# Patient Record
Sex: Male | Born: 1960 | Race: Black or African American | Hispanic: No | Marital: Married | Smoking: Never smoker
Health system: Southern US, Community
[De-identification: ages and names within clinical notes are randomized; demographics above are authoritative.]

## PROBLEM LIST (undated history)

## (undated) DIAGNOSIS — K259 Gastric ulcer, unspecified as acute or chronic, without hemorrhage or perforation: Secondary | ICD-10-CM

## (undated) DIAGNOSIS — I639 Cerebral infarction, unspecified: Secondary | ICD-10-CM

## (undated) HISTORY — PX: CHOLECYSTECTOMY: SHX55

## (undated) HISTORY — PX: ANKLE ARTHROSCOPY: SUR85

---

## 2009-02-18 ENCOUNTER — Emergency Department (HOSPITAL_BASED_OUTPATIENT_CLINIC_OR_DEPARTMENT_OTHER): Admission: EM | Admit: 2009-02-18 | Discharge: 2009-02-18 | Payer: Self-pay | Admitting: Emergency Medicine

## 2009-10-07 ENCOUNTER — Ambulatory Visit: Payer: Self-pay | Admitting: Radiology

## 2009-10-07 ENCOUNTER — Emergency Department (HOSPITAL_BASED_OUTPATIENT_CLINIC_OR_DEPARTMENT_OTHER): Admission: EM | Admit: 2009-10-07 | Discharge: 2009-10-07 | Payer: Self-pay | Admitting: Emergency Medicine

## 2010-04-13 ENCOUNTER — Emergency Department (HOSPITAL_BASED_OUTPATIENT_CLINIC_OR_DEPARTMENT_OTHER): Admission: EM | Admit: 2010-04-13 | Discharge: 2010-04-13 | Payer: Self-pay | Admitting: Emergency Medicine

## 2012-01-31 ENCOUNTER — Emergency Department (HOSPITAL_BASED_OUTPATIENT_CLINIC_OR_DEPARTMENT_OTHER): Payer: Federal, State, Local not specified - PPO

## 2012-01-31 ENCOUNTER — Emergency Department (HOSPITAL_BASED_OUTPATIENT_CLINIC_OR_DEPARTMENT_OTHER)
Admission: EM | Admit: 2012-01-31 | Discharge: 2012-01-31 | Disposition: A | Discharge status: Home / Self Care | Payer: Federal, State, Local not specified - PPO | Attending: Emergency Medicine | Admitting: Emergency Medicine

## 2012-01-31 ENCOUNTER — Encounter (HOSPITAL_BASED_OUTPATIENT_CLINIC_OR_DEPARTMENT_OTHER): Payer: Self-pay | Admitting: *Deleted

## 2012-01-31 DIAGNOSIS — J189 Pneumonia, unspecified organism: Secondary | ICD-10-CM | POA: Insufficient documentation

## 2012-01-31 DIAGNOSIS — R197 Diarrhea, unspecified: Secondary | ICD-10-CM | POA: Insufficient documentation

## 2012-01-31 LAB — URINALYSIS, ROUTINE W REFLEX MICROSCOPIC
Ketones, ur: 15 mg/dL — AB
Leukocytes, UA: NEGATIVE
Nitrite: NEGATIVE
Protein, ur: NEGATIVE mg/dL
pH: 5.5 (ref 5.0–8.0)

## 2012-01-31 LAB — CBC WITH DIFFERENTIAL/PLATELET
Basophils Relative: 0 % (ref 0–1)
Eosinophils Absolute: 0.1 10*3/uL (ref 0.0–0.7)
Eosinophils Relative: 1 % (ref 0–5)
HCT: 39.1 % (ref 39.0–52.0)
Hemoglobin: 13.9 g/dL (ref 13.0–17.0)
Lymphocytes Relative: 16 % (ref 12–46)
Lymphs Abs: 1.3 10*3/uL (ref 0.7–4.0)
MCHC: 35.5 g/dL (ref 30.0–36.0)
Monocytes Absolute: 0.8 10*3/uL (ref 0.1–1.0)
Monocytes Relative: 10 % (ref 3–12)
Platelets: 320 10*3/uL (ref 150–400)

## 2012-01-31 LAB — BASIC METABOLIC PANEL
BUN: 11 mg/dL (ref 6–23)
GFR calc Af Amer: >90 mL/min (ref >90)
Glucose, Bld: 115 mg/dL — ABNORMAL HIGH (ref 70–99)

## 2012-01-31 LAB — URINE MICROSCOPIC-ADD ON

## 2012-01-31 MED ORDER — GI COCKTAIL ~~LOC~~
30.0000 mL | Freq: Once | ORAL | Status: AC
  Administered 2012-01-31: 30 mL via ORAL
  Filled 2012-01-31: qty 30

## 2012-01-31 MED ORDER — AMOXICILLIN-POT CLAVULANATE 875-125 MG PO TABS: 1.0000 | ORAL_TABLET | Freq: Two times a day (BID) | ORAL | Status: AC

## 2012-01-31 NOTE — ED Notes (Signed)
States he has N/V/D, "cant keep nothing down", stomach "just hurts", and is coughing.

## 2012-01-31 NOTE — ED Provider Notes (Addendum)
History     CSN: [id number]  Arrival date & time 01/31/12  0316   First MD Initiated Contact with Patient 01/31/12 [phone]      Chief Complaint  Patient presents with  . Abdominal Pain    (Consider location/radiation/quality/duration/timing/severity/associated sxs/prior treatment) Patient is a 51 y.o. male presenting with abdominal pain. The history is provided by the patient.  Abdominal Pain The primary symptoms of the illness include abdominal pain, fever and diarrhea. The primary symptoms of the illness do not include shortness of breath or vomiting. The current episode started more than 2 days ago. The onset of the illness was gradual. The problem has not changed since onset. The abdominal pain began more than 2 days ago. The pain came on gradually. The abdominal pain has been unchanged since its onset. The abdominal pain is generalized. The abdominal pain does not radiate. The severity of the abdominal pain is 5/10. The abdominal pain is relieved by nothing. The abdominal pain is exacerbated by bowel movements.  The fever began 3 to 5 days ago. The fever has been unchanged since its onset. The maximum temperature recorded prior to his arrival was more than 104 F.  The diarrhea began 3 to 5 days ago.  The illness is associated with a recent illness. The patient has had a change in bowel habit. Symptoms associated with the illness do not include chills, anorexia, diaphoresis, constipation, urgency, hematuria, frequency or back pain.  Has also had a cough which is not productive.   History reviewed. No pertinent past medical history.  History reviewed. No pertinent past surgical history.  No family history on file.  History  Substance Use Topics  . Smoking status: Never Smoker   . Smokeless tobacco: Not on file  . Alcohol Use: No      Review of Systems  Constitutional: Positive for fever. Negative for chills and diaphoresis.  HENT: Negative for neck pain.   Respiratory:  Positive for cough. Negative for shortness of breath.   Cardiovascular: Negative for chest pain.  Gastrointestinal: Positive for abdominal pain and diarrhea. Negative for vomiting, constipation and anorexia.  Genitourinary: Negative for urgency, frequency and hematuria.  Musculoskeletal: Negative for back pain.  All other systems reviewed and are negative.    Allergies  Morphine and related  Home Medications   Current Outpatient Rx  Name Route Sig Dispense Refill  . AMOXICILLIN-POT CLAVULANATE 875-125 MG PO TABS Oral Take 1 tablet by mouth every 12 (twelve) hours. 20 tablet 0    BP 131/91  Pulse 92  Temp 98.4 F (36.9 C) (Oral)  Resp 20  SpO2 99%  Physical Exam  Constitutional: He is oriented to person, place, and time. He appears well-developed and well-nourished. No distress.  HENT:  Head: Normocephalic and atraumatic.  Mouth/Throat: Oropharynx is clear and moist.  Eyes: Conjunctivae are normal. Pupils are equal, round, and reactive to light.  Neck: Normal range of motion. Neck supple.  Cardiovascular: Normal rate and regular rhythm.   Pulmonary/Chest: Effort normal. He has decreased breath sounds. He has no wheezes. He exhibits no tenderness.  Abdominal: Soft. Bowel sounds are normal. There is no tenderness. There is no rebound and no guarding.  Musculoskeletal: Normal range of motion.  Neurological: He is alert and oriented to person, place, and time.  Skin: Skin is warm and dry. He is not diaphoretic.  Psychiatric: He has a normal mood and affect.    ED Course  Procedures (including critical care time)  Labs Reviewed  BASIC METABOLIC PANEL - Abnormal; Notable for the following:    Sodium 133 (*)     Glucose, Bld 115 (*)     GFR calc non Af Amer 85 (*)     All other components within normal limits  URINALYSIS, ROUTINE W REFLEX MICROSCOPIC - Abnormal; Notable for the following:    Color, Urine AMBER (*)  BIOCHEMICALS MAY BE AFFECTED BY COLOR   Hgb urine  dipstick SMALL (*)     Bilirubin Urine SMALL (*)     Ketones, ur 15 (*)     All other components within normal limits  URINE MICROSCOPIC-ADD ON - Abnormal; Notable for the following:    Bacteria, UA FEW (*)     All other components within normal limits  CBC WITH DIFFERENTIAL   Ct Abdomen Pelvis Wo Contrast  01/31/2012  *RADIOLOGY REPORT*  Clinical Data: Nausea, vomiting, diarrhea.  Cough.  CT ABDOMEN AND PELVIS WITHOUT CONTRAST  Technique:  Multidetector CT imaging of the abdomen and pelvis was performed following the standard protocol without intravenous contrast.  Comparison: None.  Findings: Atelectasis or infiltration in the right lung base.  The unenhanced appearance of the liver, spleen, gallbladder, pancreas, adrenal glands, abdominal aorta, and retroperitoneal lymph nodes is unremarkable.  Punctate sized stone in the midportion of the left kidney.  No evidence of pyelocaliectasis or ureterectasis.  No ureteral or bladder stones are visualized.  The bladder is decompressed which likely accounts for apparent wall thickening.  The stomach, small bowel, and colon are not abnormally distended. No free air or free fluid in the abdomen.  Pelvis:  Prostate gland is not enlarged.  No free or loculated pelvic fluid collections.  The appendix is normal.  No diverticulitis.  Normal alignment of the lumbar vertebrae.  IMPRESSION: Infiltration or atelectasis in the right lung base.  Punctate nonobstructing left intrarenal stone.  No acute process demonstrated in the abdomen or pelvis.  Original Report Authenticated By: Marlon Pel, M.D.     1. Community acquired pneumonia   2. Diarrhea       MDM  Return immediately for chest pain, peristent fevers, shortness of breath, persistent diarrhea or any concerns.  Patient verbalizes understanding and agrees to follow up        Artina Minella K Avanelle Pixley-Rasch, MD 01/31/12 0502  Cleotha Tsang Smitty Cords, MD 01/31/12 1610

## 2013-03-12 ENCOUNTER — Emergency Department (HOSPITAL_BASED_OUTPATIENT_CLINIC_OR_DEPARTMENT_OTHER): Payer: Federal, State, Local not specified - PPO

## 2013-03-12 ENCOUNTER — Encounter (HOSPITAL_BASED_OUTPATIENT_CLINIC_OR_DEPARTMENT_OTHER): Payer: Self-pay

## 2013-03-12 ENCOUNTER — Emergency Department (HOSPITAL_BASED_OUTPATIENT_CLINIC_OR_DEPARTMENT_OTHER)
Admission: EM | Admit: 2013-03-12 | Discharge: 2013-03-12 | Disposition: A | Discharge status: Home / Self Care | Payer: Federal, State, Local not specified - PPO | Attending: Emergency Medicine | Admitting: Emergency Medicine

## 2013-03-12 DIAGNOSIS — R109 Unspecified abdominal pain: Secondary | ICD-10-CM

## 2013-03-12 DIAGNOSIS — R1011 Right upper quadrant pain: Secondary | ICD-10-CM | POA: Insufficient documentation

## 2013-03-12 LAB — COMPREHENSIVE METABOLIC PANEL
ALT: 19 U/L (ref 0–53)
Albumin: 3.8 g/dL (ref 3.5–5.2)
Alkaline Phosphatase: 73 U/L (ref 39–117)
CO2: 30 mEq/L (ref 19–32)
Calcium: 9.4 mg/dL (ref 8.4–10.5)
Creatinine, Ser: 1 mg/dL (ref 0.50–1.35)
GFR calc non Af Amer: 84 mL/min — ABNORMAL LOW (ref >90)
Potassium: 3.4 mEq/L — ABNORMAL LOW (ref 3.5–5.1)

## 2013-03-12 LAB — URINALYSIS, ROUTINE W REFLEX MICROSCOPIC
Ketones, ur: NEGATIVE mg/dL
Leukocytes, UA: NEGATIVE
Protein, ur: NEGATIVE mg/dL
Specific Gravity, Urine: 1.023 (ref 1.005–1.030)
Urobilinogen, UA: 1 mg/dL (ref 0.0–1.0)

## 2013-03-12 LAB — URINE MICROSCOPIC-ADD ON

## 2013-03-12 LAB — CBC WITH DIFFERENTIAL/PLATELET
Basophils Absolute: 0 10*3/uL (ref 0.0–0.1)
Basophils Relative: 0 % (ref 0–1)
Eosinophils Absolute: 0.3 10*3/uL (ref 0.0–0.7)
HCT: 42.6 % (ref 39.0–52.0)
Hemoglobin: 14.9 g/dL (ref 13.0–17.0)
Lymphocytes Relative: 32 % (ref 12–46)
Monocytes Absolute: 1 10*3/uL (ref 0.1–1.0)
Neutrophils Relative %: 57 % (ref 43–77)
RDW: 12.4 % (ref 11.5–15.5)
WBC: 12.5 10*3/uL — ABNORMAL HIGH (ref 4.0–10.5)

## 2013-03-12 MED ORDER — HYDROMORPHONE HCL PF 1 MG/ML IJ SOLN
1.0000 mg | Freq: Once | INTRAMUSCULAR | Status: AC
  Administered 2013-03-12: 1 mg via INTRAVENOUS
  Filled 2013-03-12: qty 1

## 2013-03-12 MED ORDER — SODIUM CHLORIDE 0.9 % IV BOLUS (SEPSIS)
1000.0000 mL | Freq: Once | INTRAVENOUS | Status: AC
  Administered 2013-03-12: 1000 mL via INTRAVENOUS

## 2013-03-12 MED ORDER — HYDROCODONE-ACETAMINOPHEN 5-325 MG PO TABS
2.0000 | ORAL_TABLET | ORAL | Status: DC | PRN
Start: 2013-03-12 — End: 2020-07-12

## 2013-03-12 MED ORDER — IOHEXOL 300 MG/ML  SOLN
50.0000 mL | Freq: Once | INTRAMUSCULAR | Status: AC | PRN
  Administered 2013-03-12: 50 mL via ORAL

## 2013-03-12 MED ORDER — IOHEXOL 300 MG/ML  SOLN
100.0000 mL | Freq: Once | INTRAMUSCULAR | Status: AC | PRN
  Administered 2013-03-12: 100 mL via INTRAVENOUS

## 2013-03-12 MED ORDER — PROMETHAZINE HCL 25 MG/ML IJ SOLN
12.5000 mg | Freq: Once | INTRAMUSCULAR | Status: AC
  Administered 2013-03-12: 12.5 mg via INTRAVENOUS
  Filled 2013-03-12: qty 1

## 2013-03-12 NOTE — ED Provider Notes (Signed)
CSN: [id number]     Arrival date & time 03/12/13  4098 History   First MD Initiated Contact with Patient 03/12/13 [phone]     Chief Complaint  Patient presents with  . Abdominal Pain   (Consider location/radiation/quality/duration/timing/severity/associated sxs/prior Treatment) HPI Comments: Patient is a 52 year old otherwise healthy male who presents with complaints of right upper quadrant abdominal pain. He states that this started 2 hours prior to arrival and has worsened rapidly. He denies vomiting or diarrhea. He denies any constipation. He denies fever. He denies any history of gallstones and denies he has had any similar episodes in the past the  Patient is a 52 y.o. male presenting with abdominal pain. The history is provided by the patient.  Abdominal Pain Pain location:  RUQ Pain quality: cramping   Pain radiates to:  Does not radiate Pain severity:  Moderate Onset quality:  Sudden Duration:  2 hours Timing:  Constant Progression:  Worsening Chronicity:  New Context: not alcohol use, not diet changes and not recent illness   Relieved by:  Nothing Worsened by:  Movement and palpation Ineffective treatments:  None tried   History reviewed. No pertinent past medical history. History reviewed. No pertinent past surgical history. No family history on file. History  Substance Use Topics  . Smoking status: Never Smoker   . Smokeless tobacco: Not on file  . Alcohol Use: No    Review of Systems  Gastrointestinal: Positive for abdominal pain.  All other systems reviewed and are negative.    Allergies  Morphine and related  Home Medications  No current outpatient prescriptions on file. BP 163/106  Pulse 79  Temp(Src) 98.5 F (36.9 C) (Oral)  Resp 20  Ht 6\' 1"  (1.854 m)  Wt 217 lb (98.431 kg)  BMI 28.64 kg/m2  SpO2 100% Physical Exam  Nursing note and vitals reviewed. Constitutional: He is oriented to person, place, and time. He appears well-developed and  well-nourished. No distress.  HENT:  Head: Normocephalic and atraumatic.  Mouth/Throat: Oropharynx is clear and moist.  Neck: Normal range of motion. Neck supple.  Cardiovascular: Normal rate, regular rhythm and normal heart sounds.   No murmur heard. Pulmonary/Chest: Effort normal and breath sounds normal. No respiratory distress.  Abdominal: Bowel sounds are normal. He exhibits no distension and no mass. There is tenderness. There is no rebound and no guarding.  There is tenderness to palpation in the right upper quadrant and right mid abdomen without rebound or guarding. Bowel sounds are present  Neurological: He is alert and oriented to person, place, and time.  Skin: Skin is warm and dry. He is not diaphoretic.    ED Course  Procedures (including critical care time) Labs Review Labs Reviewed  URINALYSIS, ROUTINE W REFLEX MICROSCOPIC  CBC WITH DIFFERENTIAL  COMPREHENSIVE METABOLIC PANEL  LIPASE, BLOOD   Imaging Review No results found.  MDM  No diagnosis found. Patient presents here with complaints of right upper abdominal pain that started abruptly at approximately 2 AM. He is tender in this area however there is no rebound or guarding. Workup reveals a mildly elevated white count of 12,000, however CT scan of the abdomen and pelvis is normal without evidence for intra-abdominal pathology. Specifically his gallbladder and appendix appear normal. He will be discharged to home with pain medication and followup as needed if his symptoms worsen or change.     Geoffery Lyons, MD 03/12/13 1008

## 2013-03-12 NOTE — ED Notes (Signed)
MD at bedside. 

## 2013-03-12 NOTE — ED Notes (Signed)
MD at bedside for disposition 

## 2013-03-12 NOTE — ED Notes (Signed)
Returned from CT.

## 2013-03-12 NOTE — ED Notes (Signed)
Patient instructed not to drive, attempting to find ride

## 2013-03-12 NOTE — ED Notes (Signed)
Patient here with epigastric pain x 2 hours. Reports that the pain has increased with nausea, denies diarrhea. Reports remote hx of ulcers, denies noticing any blood in stool

## 2014-02-25 ENCOUNTER — Emergency Department (HOSPITAL_BASED_OUTPATIENT_CLINIC_OR_DEPARTMENT_OTHER): Payer: Federal, State, Local not specified - PPO

## 2014-02-25 ENCOUNTER — Encounter (HOSPITAL_BASED_OUTPATIENT_CLINIC_OR_DEPARTMENT_OTHER): Payer: Self-pay | Admitting: Emergency Medicine

## 2014-02-25 ENCOUNTER — Emergency Department (HOSPITAL_BASED_OUTPATIENT_CLINIC_OR_DEPARTMENT_OTHER)
Admission: EM | Admit: 2014-02-25 | Discharge: 2014-02-25 | Disposition: A | Discharge status: Home / Self Care | Payer: Federal, State, Local not specified - PPO | Attending: Emergency Medicine | Admitting: Emergency Medicine

## 2014-02-25 DIAGNOSIS — Z79899 Other long term (current) drug therapy: Secondary | ICD-10-CM | POA: Diagnosis not present

## 2014-02-25 DIAGNOSIS — R21 Rash and other nonspecific skin eruption: Secondary | ICD-10-CM | POA: Insufficient documentation

## 2014-02-25 DIAGNOSIS — IMO0002 Reserved for concepts with insufficient information to code with codable children: Secondary | ICD-10-CM | POA: Insufficient documentation

## 2014-02-25 DIAGNOSIS — R0609 Other forms of dyspnea: Secondary | ICD-10-CM | POA: Diagnosis not present

## 2014-02-25 DIAGNOSIS — R0989 Other specified symptoms and signs involving the circulatory and respiratory systems: Secondary | ICD-10-CM | POA: Insufficient documentation

## 2014-02-25 DIAGNOSIS — R0600 Dyspnea, unspecified: Secondary | ICD-10-CM

## 2014-02-25 LAB — CBC WITH DIFFERENTIAL/PLATELET
BASOS ABS: 0 10*3/uL (ref 0.0–0.1)
Basophils Relative: 1 % (ref 0–1)
EOS ABS: 0.4 10*3/uL (ref 0.0–0.7)
EOS PCT: 5 % (ref 0–5)
HEMATOCRIT: 42.2 % (ref 39.0–52.0)
Hemoglobin: 14.9 g/dL (ref 13.0–17.0)
LYMPHS ABS: 3 10*3/uL (ref 0.7–4.0)
LYMPHS PCT: 40 % (ref 12–46)
MCH: 31.6 pg (ref 26.0–34.0)
MCHC: 35.3 g/dL (ref 30.0–36.0)
MCV: 89.6 fL (ref 78.0–100.0)
MONOS PCT: 10 % (ref 3–12)
Monocytes Absolute: 0.7 10*3/uL (ref 0.1–1.0)
NEUTROS PCT: 44 % (ref 43–77)
Neutro Abs: 3.5 10*3/uL (ref 1.7–7.7)
PLATELETS: 280 10*3/uL (ref 150–400)
RBC: 4.71 MIL/uL (ref 4.22–5.81)
RDW: 12.8 % (ref 11.5–15.5)
WBC: 7.6 10*3/uL (ref 4.0–10.5)

## 2014-02-25 LAB — BASIC METABOLIC PANEL
Anion gap: 12 (ref 5–15)
BUN: 10 mg/dL (ref 6–23)
CHLORIDE: 99 meq/L (ref 96–112)
CO2: 26 mEq/L (ref 19–32)
CREATININE: 0.9 mg/dL (ref 0.50–1.35)
Calcium: 9.5 mg/dL (ref 8.4–10.5)
GFR calc Af Amer: >90 mL/min (ref >90)
GFR calc non Af Amer: >90 mL/min (ref >90)
Glucose, Bld: 101 mg/dL — ABNORMAL HIGH (ref 70–99)
Potassium: 4.1 mEq/L (ref 3.7–5.3)
Sodium: 137 mEq/L (ref 137–147)

## 2014-02-25 LAB — TROPONIN I

## 2014-02-25 LAB — D-DIMER, QUANTITATIVE (NOT AT ARMC)

## 2014-02-25 MED ORDER — ALBUTEROL SULFATE HFA 108 (90 BASE) MCG/ACT IN AERS: 1.0000 | INHALATION_SPRAY | Freq: Four times a day (QID) | RESPIRATORY_TRACT | Status: AC | PRN

## 2014-02-25 MED ORDER — DIPHENHYDRAMINE HCL 25 MG PO TABS: 25.0000 mg | ORAL_TABLET | Freq: Four times a day (QID) | ORAL | Status: DC

## 2014-02-25 MED ORDER — HYDROCORTISONE 1 % EX CREA: TOPICAL_CREAM | CUTANEOUS | Status: DC

## 2014-02-25 NOTE — Discharge Instructions (Signed)
Rash There is no evidence of heart attack or blood clot in the lung.  Use the inhaler and cream as prescribed. Return to the ED if you develop new or worsening symptoms. A rash is a change in the color or texture of your skin. There are many different types of rashes. You may have other problems that accompany your rash. CAUSES   Infections.  Allergic reactions. This can include allergies to pets or foods.  Certain medicines.  Exposure to certain chemicals, soaps, or cosmetics.  Heat.  Exposure to poisonous plants.  Tumors, both cancerous and noncancerous. SYMPTOMS   Redness.  Scaly skin.  Itchy skin.  Dry or cracked skin.  Bumps.  Blisters.  Pain. DIAGNOSIS  Your caregiver may do a physical exam to determine what type of rash you have. A skin sample (biopsy) may be taken and examined under a microscope. TREATMENT  Treatment depends on the type of rash you have. Your caregiver may prescribe certain medicines. For serious conditions, you may need to see a skin doctor (dermatologist). HOME CARE INSTRUCTIONS   Avoid the substance that caused your rash.  Do not scratch your rash. This can cause infection.  You may take cool baths to help stop itching.  Only take over-the-counter or prescription medicines as directed by your caregiver.  Keep all follow-up appointments as directed by your caregiver. SEEK IMMEDIATE MEDICAL CARE IF:  You have increasing pain, swelling, or redness.  You have a fever.  You have new or severe symptoms.  You have body aches, diarrhea, or vomiting.  Your rash is not better after 3 days. MAKE SURE YOU:  Understand these instructions.  Will watch your condition.  Will get help right away if you are not doing well or get worse. Document Released: 05/22/2002 Document Revised: 08/24/2011 Document Reviewed: 03/16/2011 [facility] Patient Information 2015 [place], [place]. This information is not intended to replace advice given to you by  your health care provider. Make sure you discuss any questions you have with your health care provider.

## 2014-02-25 NOTE — ED Notes (Addendum)
Patient states that he thinks he has poison ivy exposure, did yard work Monday. States that he is having shortness of breath. Stated he has poison ivy in his lungs.

## 2014-02-25 NOTE — ED Provider Notes (Signed)
CSN: [id number]     Arrival date & time 02/25/14  1140 History   First MD Initiated Contact with Patient 02/25/14 1255   This chart was scribed for Glynn Octave, MD by Freida Busman, ED Scribe. This patient was seen in room MHOTF/OTF and the patient's care was started 1:08 PM.    Chief Complaint  Patient presents with  . Rash      The history is provided by the patient.    HPI Comments:  Gregory Wong is a 53 y.o. male who presents to the Emergency Department complaining of rash to bilateral forearms that appeared three days ago. Py notes he was working in his yard seven days ago, believes he was exposed to poison ivy but notes he did not see any poison ivy leaves. Pt also reports burning sensation in his lungs and CP with inspiration. Denies cough, fever and  h/o COPD/asthma. No alleviating factors noted.   History reviewed. No pertinent past medical history. Past Surgical History  Procedure Laterality Date  . Ankle arthroscopy    . Cholecystectomy     No family history on file. History  Substance Use Topics  . Smoking status: Never Smoker   . Smokeless tobacco: Not on file  . Alcohol Use: No    Review of Systems  Skin: Positive for rash.  All other systems reviewed and are negative.     Allergies  Morphine and related  Home Medications   Prior to Admission medications   Medication Sig Start Date End Date Taking? Authorizing Provider  albuterol (PROVENTIL HFA;VENTOLIN HFA) 108 (90 BASE) MCG/ACT inhaler Inhale 1-2 puffs into the lungs every 6 (six) hours as needed for wheezing or shortness of breath. 02/25/14   Glynn Octave, MD  diphenhydrAMINE (BENADRYL) 25 MG tablet Take 1 tablet (25 mg total) by mouth every 6 (six) hours. 02/25/14   Glynn Octave, MD  HYDROcodone-acetaminophen (NORCO) 5-325 MG per tablet Take 2 tablets by mouth every 4 (four) hours as needed for pain. 03/12/13   Geoffery Lyons, MD  hydrocortisone cream 1 % Apply to affected area 2 times daily  02/25/14   Glynn Octave, MD   BP 117/81  Pulse 80  Temp(Src) 97.9 F (36.6 C) (Oral)  Resp 18  Ht  (1.854 m)  Wt 217 lb (98.431 kg)  BMI 28.64 kg/m2  SpO2 99% Physical Exam  Nursing note and vitals reviewed. Constitutional: He is oriented to person, place, and time. He appears well-developed and well-nourished. No distress.  HENT:  Head: Normocephalic and atraumatic.  Mouth/Throat: Oropharynx is clear and moist. No oropharyngeal exudate.  Eyes: Conjunctivae and EOM are normal. Pupils are equal, round, and reactive to light.  Neck: Normal range of motion. Neck supple.  No meningismus.  Cardiovascular: Normal rate, regular rhythm, normal heart sounds and intact distal pulses.   No murmur heard. Pulmonary/Chest: Effort normal and breath sounds normal. No respiratory distress.  Abdominal: Soft. There is no tenderness. There is no rebound and no guarding.  Musculoskeletal: Normal range of motion. He exhibits no edema and no tenderness.  Neurological: He is alert and oriented to person, place, and time. No cranial nerve deficit. He exhibits normal muscle tone. Coordination normal.  No ataxia on finger to nose bilaterally. No pronator drift. 5/5 strength throughout. CN 2-12 intact. Negative Romberg. Equal grip strength. Sensation intact. Gait is normal.   Skin: Skin is warm.  Fluid filled bulla to right ulnar wrist  Excoriations to left forearm   Psychiatric: He has  a normal mood and affect. His behavior is normal.    ED Course  Procedures (including critical care time)  DIAGNOSTIC STUDIES:  Oxygen Saturation is 98% on RA, normal by my interpretation.    COORDINATION OF CARE:  5:22 PM Discussed treatment plan with pt at bedside and pt agreed to plan.  Labs Review Labs Reviewed  BASIC METABOLIC PANEL - Abnormal; Notable for the following:    Glucose, Bld 101 (*)    All other components within normal limits  CBC WITH DIFFERENTIAL  TROPONIN I  D-DIMER, QUANTITATIVE     Imaging Review Dg Chest 2 View  02/25/2014   CLINICAL DATA:  Poison ivy exposure.  Shortness of breath.  EXAM: CHEST  2 VIEW  COMPARISON:  01/09/2014.  FINDINGS: The heart size and mediastinal contours are within normal limits. Both lungs are clear. The visualized skeletal structures are unremarkable. Cholecystectomy clips noted within the right upper quadrant.  IMPRESSION: No active cardiopulmonary disease.   Electronically Signed   By: Signa Kell M.D.   On: 02/25/2014 13:04     EKG Interpretation   Date/Time:  Sunday February 25 2014 13:41:02 EDT Ventricular Rate:  59 PR Interval:  174 QRS Duration: 102 QT Interval:  406 QTC Calculation: 401 R Axis:   -14 Text Interpretation:  Sinus bradycardia with sinus arrhythmia Otherwise  normal ECG No previous ECGs available Confirmed by Manus Gunning  MD, Aravind Chrismer  [id number]) on 02/25/2014 2:02:27 PM      MDM   Final diagnoses:  Dyspnea  Rash   patient feels "burning in his chest" since being exposed to poison ivy 6 days ago. Has a rash to his right wrist with a fluid-filled bulla. Denies any chest pain with exertion.  EKG normal sinus rhythm without acute ST changes. CXR negative.  Troponin and d-dimer negative.  Lungs clear.  Albuterol given for symptom control.  Hydrocortisone for rash.  Doubt ACS, PE, aortic dissection.  I personally performed the services described in this documentation, which was scribed in my presence. The recorded information has been reviewed and is accurate.   Glynn Octave, MD 02/25/14 1723

## 2014-12-17 ENCOUNTER — Encounter (HOSPITAL_BASED_OUTPATIENT_CLINIC_OR_DEPARTMENT_OTHER): Payer: Self-pay | Admitting: Emergency Medicine

## 2014-12-17 ENCOUNTER — Emergency Department (HOSPITAL_BASED_OUTPATIENT_CLINIC_OR_DEPARTMENT_OTHER)
Admission: EM | Admit: 2014-12-17 | Discharge: 2014-12-18 | Disposition: A | Discharge status: Home / Self Care | Payer: BLUE CROSS/BLUE SHIELD | Attending: Emergency Medicine | Admitting: Emergency Medicine

## 2014-12-17 DIAGNOSIS — Z79899 Other long term (current) drug therapy: Secondary | ICD-10-CM | POA: Diagnosis not present

## 2014-12-17 DIAGNOSIS — Y9289 Other specified places as the place of occurrence of the external cause: Secondary | ICD-10-CM | POA: Diagnosis not present

## 2014-12-17 DIAGNOSIS — Z7952 Long term (current) use of systemic steroids: Secondary | ICD-10-CM | POA: Insufficient documentation

## 2014-12-17 DIAGNOSIS — Z9889 Other specified postprocedural states: Secondary | ICD-10-CM | POA: Diagnosis not present

## 2014-12-17 DIAGNOSIS — Y9389 Activity, other specified: Secondary | ICD-10-CM | POA: Diagnosis not present

## 2014-12-17 DIAGNOSIS — X58XXXA Exposure to other specified factors, initial encounter: Secondary | ICD-10-CM | POA: Diagnosis not present

## 2014-12-17 DIAGNOSIS — S93402A Sprain of unspecified ligament of left ankle, initial encounter: Secondary | ICD-10-CM | POA: Insufficient documentation

## 2014-12-17 DIAGNOSIS — Y998 Other external cause status: Secondary | ICD-10-CM | POA: Diagnosis not present

## 2014-12-17 DIAGNOSIS — S99922A Unspecified injury of left foot, initial encounter: Secondary | ICD-10-CM | POA: Diagnosis present

## 2014-12-17 MED ORDER — IBUPROFEN 800 MG PO TABS
800.0000 mg | ORAL_TABLET | Freq: Once | ORAL | Status: AC
  Administered 2014-12-18: 800 mg via ORAL
  Filled 2014-12-17: qty 1

## 2014-12-17 NOTE — ED Provider Notes (Signed)
CSN: [id number]     Arrival date & time 12/17/14  2308 History   This chart was scribed for San Lohmeyer, MD by Evon Slackerrance Branch, ED Scribe. This patient was seen in room MH12/MH12 and the patient's care was started at 11:48 PM.    Chief Complaint  Patient presents with  . Foot Injury    Patient is a 54 y.o. male presenting with foot injury. The history is provided by the patient. No language interpreter was used.  Foot Injury Location:  Ankle Time since incident:  2 days Injury: yes   Ankle location:  L ankle Pain details:    Quality:  Aching   Radiates to:  Does not radiate   Severity:  Mild   Onset quality:  Sudden   Duration:  2 days   Timing:  Constant   Progression:  Unchanged Chronicity:  New Dislocation: no   Relieved by:  Ice Worsened by:  Bearing weight Associated symptoms: no muscle weakness, no numbness and no tingling   Risk factors: no concern for non-accidental trauma    HPI Comments: Gregory Wong is a 54 y.o. male who presents to the Emergency Department complaining of left ankle injury onset 2 days prior. Pt states that he may have sprained his ankle. Pt states that he was playing with his son when he injured the foot. Pt denies any medications PTA. Pt states that he soaked the ankle in ice water and vinegar which provided slight relief. Pt states that the pain is worse when bearing weight. Pt doesn't report ant numbness or tingling.     History reviewed. No pertinent past medical history. Past Surgical History  Procedure Laterality Date  . Ankle arthroscopy    . Cholecystectomy     History reviewed. No pertinent family history. History  Substance Use Topics  . Smoking status: Never Smoker   . Smokeless tobacco: Not on file  . Alcohol Use: No    Review of Systems  Musculoskeletal: Positive for arthralgias.  Neurological: Negative for numbness.  All other systems reviewed and are negative.    Allergies  Morphine and related  Home Medications    Prior to Admission medications   Medication Sig Start Date End Date Taking? Authorizing Provider  albuterol (PROVENTIL HFA;VENTOLIN HFA) 108 (90 BASE) MCG/ACT inhaler Inhale 1-2 puffs into the lungs every 6 (six) hours as needed for wheezing or shortness of breath. 02/25/14   Glynn OctaveStephen Rancour, MD  diphenhydrAMINE (BENADRYL) 25 MG tablet Take 1 tablet (25 mg total) by mouth every 6 (six) hours. 02/25/14   Glynn OctaveStephen Rancour, MD  HYDROcodone-acetaminophen (NORCO) 5-325 MG per tablet Take 2 tablets by mouth every 4 (four) hours as needed for pain. 03/12/13   Geoffery Lyonsouglas Delo, MD  hydrocortisone cream 1 % Apply to affected area 2 times daily 02/25/14   Glynn OctaveStephen Rancour, MD   BP 137/88 mmHg  Pulse 81  Temp(Src) 97.8 F (36.6 C) (Oral)  Resp 18  Ht 6\' 1"  (1.854 m)  Wt 217 lb (98.431 kg)  BMI 28.64 kg/m2  SpO2 98%   Physical Exam  Constitutional: He is oriented to person, place, and time. He appears well-developed and well-nourished. No distress.  HENT:  Head: Normocephalic and atraumatic.  Mouth/Throat: Oropharynx is clear and moist. No oropharyngeal exudate.  Eyes: Conjunctivae and EOM are normal. Pupils are equal, round, and reactive to light.  Neck: Normal range of motion. Neck supple. No tracheal deviation present.  Cardiovascular: Normal rate and regular rhythm.   Pulmonary/Chest: Effort normal  and breath sounds normal. No respiratory distress. He has no wheezes. He has no rales.  Abdominal: Soft. Bowel sounds are normal. There is no tenderness. There is no rebound and no guarding.  Musculoskeletal: Normal range of motion. He exhibits tenderness.       Left ankle: He exhibits normal range of motion, no swelling, no ecchymosis, no deformity, no laceration and normal pulse. No posterior TFL, no head of 5th metatarsal and no proximal fibula tenderness found. Achilles tendon normal. Achilles tendon exhibits no defect.       Left foot: There is normal range of motion, no tenderness, no bony tenderness,  no swelling, normal capillary refill, no crepitus, no deformity and no laceration.  Achilles intact,  3+ DP, Cap refill less than 3 seconds toenail fungus  Neurological: He is alert and oriented to person, place, and time. He has normal reflexes. He displays normal reflexes. He exhibits normal muscle tone.  Skin: Skin is warm and dry.  Psychiatric: He has a normal mood and affect. His behavior is normal.  Nursing note and vitals reviewed.   ED Course  Procedures (including critical care time) DIAGNOSTIC STUDIES: Oxygen Saturation is 98% on RA, normal by my interpretation.    COORDINATION OF CARE: 11:55 PM-Discussed treatment plan with pt at bedside and pt agreed to plan.     Labs Review Labs Reviewed - No data to display  Imaging Review No results found.   EKG Interpretation None      MDM   Final diagnoses:  None    Is able to walk on it. Achilles tendon is intact normal xrays, ice elevation and NSAIDs will treat for sprain  I personally performed the services described in this documentation, which was scribed in my presence. The recorded information has been reviewed and is accurate.       Cy Blamer, MD 12/18/14 [phone]

## 2014-12-17 NOTE — ED Notes (Signed)
Patient reports left foot injury which began on Saturday.

## 2014-12-18 ENCOUNTER — Encounter (HOSPITAL_BASED_OUTPATIENT_CLINIC_OR_DEPARTMENT_OTHER): Payer: Self-pay | Admitting: Emergency Medicine

## 2014-12-18 ENCOUNTER — Emergency Department (HOSPITAL_BASED_OUTPATIENT_CLINIC_OR_DEPARTMENT_OTHER): Payer: BLUE CROSS/BLUE SHIELD

## 2014-12-18 MED ORDER — NAPROXEN 375 MG PO TABS: 375.0000 mg | ORAL_TABLET | Freq: Two times a day (BID) | ORAL | Status: DC

## 2014-12-18 NOTE — Discharge Instructions (Signed)
Ankle Sprain  An ankle sprain is an injury to the strong, fibrous tissues (ligaments) that hold your ankle bones together.   HOME CARE   · Put ice on your ankle for 1-2 days or as told by your doctor.  ¨ Put ice in a plastic bag.  ¨ Place a towel between your skin and the bag.  ¨ Leave the ice on for 15-20 minutes at a time, every 2 hours while you are awake.  · Only take medicine as told by your doctor.  · Raise (elevate) your injured ankle above the level of your heart as much as possible for 2-3 days.  · Use crutches if your doctor tells you to. Slowly put your own weight on the affected ankle. Use the crutches until you can walk without pain.  · If you have a plaster splint:  ¨ Do not rest it on anything harder than a pillow for 24 hours.  ¨ Do not put weight on it.  ¨ Do not get it wet.  ¨ Take it off to shower or bathe.  · If given, use an elastic wrap or support stocking for support. Take the wrap off if your toes lose feeling (numb), tingle, or turn cold or blue.  · If you have an air splint:  ¨ Add or let out air to make it comfortable.  ¨ Take it off at night and to shower and bathe.  ¨ Wiggle your toes and move your ankle up and down often while you are wearing it.  GET HELP IF:  · You have rapidly increasing bruising or puffiness (swelling).  · Your toes feel very cold.  · You lose feeling in your foot.  · Your medicine does not help your pain.  GET HELP RIGHT AWAY IF:   · Your toes lose feeling (numb) or turn blue.  · You have severe pain that is increasing.  MAKE SURE YOU:   · Understand these instructions.  · Will watch your condition.  · Will get help right away if you are not doing well or get worse.  Document Released: 11/18/2007 Document Revised: 10/16/2013 Document Reviewed: 12/14/2011  [employer]® Patient Information ©2015 [employer]. This information is not intended to replace advice given to you by your health care provider. Make sure you discuss any questions you have with your health care  provider.

## 2017-09-04 ENCOUNTER — Other Ambulatory Visit: Payer: Self-pay

## 2017-09-04 ENCOUNTER — Emergency Department (HOSPITAL_BASED_OUTPATIENT_CLINIC_OR_DEPARTMENT_OTHER): Payer: BLUE CROSS/BLUE SHIELD

## 2017-09-04 ENCOUNTER — Encounter (HOSPITAL_BASED_OUTPATIENT_CLINIC_OR_DEPARTMENT_OTHER): Payer: Self-pay | Admitting: Emergency Medicine

## 2017-09-04 ENCOUNTER — Emergency Department (HOSPITAL_BASED_OUTPATIENT_CLINIC_OR_DEPARTMENT_OTHER)
Admission: EM | Admit: 2017-09-04 | Discharge: 2017-09-04 | Disposition: A | Discharge status: Home / Self Care | Payer: BLUE CROSS/BLUE SHIELD | Attending: Emergency Medicine | Admitting: Emergency Medicine

## 2017-09-04 DIAGNOSIS — Y929 Unspecified place or not applicable: Secondary | ICD-10-CM | POA: Diagnosis not present

## 2017-09-04 DIAGNOSIS — X500XXA Overexertion from strenuous movement or load, initial encounter: Secondary | ICD-10-CM | POA: Diagnosis not present

## 2017-09-04 DIAGNOSIS — S39012A Strain of muscle, fascia and tendon of lower back, initial encounter: Secondary | ICD-10-CM | POA: Diagnosis not present

## 2017-09-04 DIAGNOSIS — Z79899 Other long term (current) drug therapy: Secondary | ICD-10-CM | POA: Diagnosis not present

## 2017-09-04 DIAGNOSIS — Y999 Unspecified external cause status: Secondary | ICD-10-CM | POA: Diagnosis not present

## 2017-09-04 DIAGNOSIS — Y9389 Activity, other specified: Secondary | ICD-10-CM | POA: Diagnosis not present

## 2017-09-04 DIAGNOSIS — S3982XA Other specified injuries of lower back, initial encounter: Secondary | ICD-10-CM | POA: Diagnosis present

## 2017-09-04 MED ORDER — METHOCARBAMOL 500 MG PO TABS: 500.0000 mg | ORAL_TABLET | Freq: Two times a day (BID) | ORAL | 0 refills | Status: DC

## 2017-09-04 MED ORDER — KETOROLAC TROMETHAMINE 30 MG/ML IJ SOLN
30.0000 mg | Freq: Once | INTRAMUSCULAR | Status: AC
  Administered 2017-09-04: 30 mg via INTRAMUSCULAR
  Filled 2017-09-04: qty 1

## 2017-09-04 MED ORDER — ACETAMINOPHEN 500 MG PO TABS: 500.0000 mg | ORAL_TABLET | Freq: Four times a day (QID) | ORAL | 0 refills | Status: DC | PRN

## 2017-09-04 MED ORDER — IBUPROFEN 600 MG PO TABS: 600.0000 mg | ORAL_TABLET | Freq: Four times a day (QID) | ORAL | 0 refills | Status: DC | PRN

## 2017-09-04 NOTE — Discharge Instructions (Signed)
Medications: Ibuprofen, Tylenol, Robaxin  Treatment: Take ibuprofen every 6 hours as prescribed.  You can alternate with Tylenol in between every 3 hours.  Take Robaxin twice daily as needed for muscle pain or spasms.  Do not drive or operate machinery while taking this medication.  Use ice and heat alternating 20 minutes on, 20 minutes off.  Attempt the exercises and stretches as tolerated.  Follow-up: Please follow-up with your doctor in the next 4-5 days for recheck and further evaluation and treatment of your ongoing symptoms.  Please return to the emergency department if you develop any new or worsening symptoms.

## 2017-09-04 NOTE — ED Provider Notes (Addendum)
[facility] EMERGENCY DEPARTMENT Provider Note   CSN: [id number] Arrival date & time: 09/04/17  1719     History   Chief Complaint Chief Complaint  Patient presents with  . Back Pain    HPI Gregory Wong is a 57 y.o. male who is previously healthy who presents with a 1 day history of low back pain.  He reports he was lifting a heavy bed when he felt pain.  He has had persistent low back pain despite Tylenol since.  He is able to walk, however slowly.  He denies any numbness or tingling in his legs.  He reports he has had tingling in his arm intermittently for almost a year, however this is not new.  He has spoken with his doctor about this.  Patient denies any bowel or bladder incontinence, saddle anesthesia, history of cancer or IVDU, procedure to back, or fevers.  He denies any other injuries.  HPI  History reviewed. No pertinent past medical history.  There are no active problems to display for this patient.   Past Surgical History:  Procedure Laterality Date  . ANKLE ARTHROSCOPY    . CHOLECYSTECTOMY          Home Medications    Prior to Admission medications   Medication Sig Start Date End Date Taking? Authorizing Provider  acetaminophen (TYLENOL) 500 MG tablet Take 1 tablet (500 mg total) by mouth every 6 (six) hours as needed. 09/04/17   Mima Cranmore, Waylan Boga, PA-C  albuterol (PROVENTIL HFA;VENTOLIN HFA) 108 (90 BASE) MCG/ACT inhaler Inhale 1-2 puffs into the lungs every 6 (six) hours as needed for wheezing or shortness of breath. 02/25/14   Rancour, Jeannett Senior, MD  diphenhydrAMINE (BENADRYL) 25 MG tablet Take 1 tablet (25 mg total) by mouth every 6 (six) hours. 02/25/14   Rancour, Jeannett Senior, MD  HYDROcodone-acetaminophen (NORCO) 5-325 MG per tablet Take 2 tablets by mouth every 4 (four) hours as needed for pain. 03/12/13   Geoffery Lyons, MD  hydrocortisone cream 1 % Apply to affected area 2 times daily 02/25/14   Rancour, Jeannett Senior, MD  ibuprofen (ADVIL,MOTRIN) 600 MG  tablet Take 1 tablet (600 mg total) by mouth every 6 (six) hours as needed. 09/04/17   Alijah Hyde, Waylan Boga, PA-C  methocarbamol (ROBAXIN) 500 MG tablet Take 1 tablet (500 mg total) by mouth 2 (two) times daily. 09/04/17   Shaquoya Cosper, Waylan Boga, PA-C  naproxen (NAPROSYN) 375 MG tablet Take 1 tablet (375 mg total) by mouth 2 (two) times daily. 12/18/14   Palumbo, April, MD    Family History No family history on file.  Social History Social History   Tobacco Use  . Smoking status: Never Smoker  . Smokeless tobacco: Never Used  Substance Use Topics  . Alcohol use: No  . Drug use: No     Allergies   Morphine and related   Review of Systems Review of Systems  Constitutional: Negative for fever.  Genitourinary: Negative for dysuria.  Musculoskeletal: Positive for back pain.  Neurological: Positive for numbness (paresthesia to LUE x 1 year).     Physical Exam Updated Vital Signs BP (!) 124/91 (BP Location: Right Arm)   Pulse 80   Temp 98.1 F (36.7 C) (Oral)   Resp 18   Ht 6\' 1"  (1.854 m)   Wt 98.4 kg (217 lb)   SpO2 98%   BMI 28.63 kg/m   Physical Exam  Constitutional: He appears well-developed and well-nourished. No distress.  HENT:  Head: Normocephalic and atraumatic.  Mouth/Throat: Oropharynx is clear and moist. No oropharyngeal exudate.  Eyes: Pupils are equal, round, and reactive to light. Conjunctivae are normal. Right eye exhibits no discharge. Left eye exhibits no discharge. No scleral icterus.  Neck: Normal range of motion. Neck supple. No thyromegaly present.  Cardiovascular: Normal rate, regular rhythm, normal heart sounds and intact distal pulses. Exam reveals no gallop and no friction rub.  No murmur heard. Pulmonary/Chest: Effort normal and breath sounds normal. No stridor. No respiratory distress. He has no wheezes. He has no rales.  Abdominal: Soft. Bowel sounds are normal. He exhibits no distension. There is no tenderness. There is no rebound and no guarding.    Musculoskeletal: He exhibits no edema.       Cervical back: He exhibits no bony tenderness.       Thoracic back: He exhibits no bony tenderness.       Lumbar back: He exhibits tenderness and bony tenderness.       Back:  Tenderness to lumbar spine, L>R 5/5 strength to bilateral upper and lower extremities  Lymphadenopathy:    He has no cervical adenopathy.  Neurological: He is alert. Coordination normal.  Equal bilateral grip strength, normal sensation to bilateral upper and lower extremities  Skin: Skin is warm and dry. No rash noted. He is not diaphoretic. No pallor.  Psychiatric: He has a normal mood and affect.  Nursing note and vitals reviewed.    ED Treatments / Results  Labs (all labs ordered are listed, but only abnormal results are displayed) Labs Reviewed - No data to display  EKG None  Radiology Dg Lumbar Spine Complete  Result Date: 09/04/2017 CLINICAL DATA:  Pain after lifting something heavy yesterday. EXAM: LUMBAR SPINE - COMPLETE 4+ VIEW COMPARISON:  CT scan of the abdomen and pelvis March 12, 2013 FINDINGS: Mild multilevel degenerative disc changes. No fractures or traumatic malalignment. Lower lumbar facet degenerative changes. IMPRESSION: Degenerative changes.  No other abnormalities. Electronically Signed   By: Gerome Sam III M.D   On: 09/04/2017 18:27    Procedures Procedures (including critical care time)  Medications Ordered in ED Medications  ketorolac (TORADOL) 30 MG/ML injection 30 mg (30 mg Intramuscular Given 09/04/17 1759)     Initial Impression / Assessment and Plan / ED Course  I have reviewed the triage vital signs and the nursing notes.  Pertinent labs & imaging results that were available during my care of the patient were reviewed by me and considered in my medical decision making (see chart for details).     Patient with back pain after lifting a heavy bed yesterday.  No neurological deficits and normal neuro exam.  Patient  is ambulatory.  No loss of bowel or bladder control.  No concern for cauda equina.  No fever, night sweats, weight loss, h/o cancer, IVDA, no recent procedure to back. No urinary symptoms suggestive of UTI.  Patient symptoms improved with Toradol IM in the ED.  Patient's chronic, intermittent tingling of his left arm may be related to cervical radicular symptoms.  Patient does have tenderness on palpation of the left upper trapezius.  He reports that he works on bumpers and is in position with his arm up throughout the day.  Supportive care and return precaution discussed.  Will discharge home with ibuprofen, Tylenol, and Robaxin.  Patient advised to follow-up with his doctor in 3-4 days for recheck.  Return precautions discussed.  Patient understands and agrees with plan.  Patient vitals stable throughout ED course and  discharged in satisfactory condition.   Final Clinical Impressions(s) / ED Diagnoses   Final diagnoses:  Strain of lumbar region, initial encounter    ED Discharge Orders        Ordered    ibuprofen (ADVIL,MOTRIN) 600 MG tablet  Every 6 hours PRN     09/04/17 1920    acetaminophen (TYLENOL) 500 MG tablet  Every 6 hours PRN     09/04/17 1920    methocarbamol (ROBAXIN) 500 MG tablet  2 times daily     09/04/17 [address]1920        Brailey Buescher M, PA-C 09/04/17 1925    Maia PlanLong, Joshua G, MD 09/05/17 1153

## 2017-09-04 NOTE — ED Notes (Signed)
Pt and family given d/c instructions as per chart. Rx x 3 with precautions. Verbalizes understanding. No questions.

## 2017-09-04 NOTE — ED Triage Notes (Signed)
Lower back pain after lifting something heavy yesterday.

## 2018-04-05 ENCOUNTER — Emergency Department (HOSPITAL_BASED_OUTPATIENT_CLINIC_OR_DEPARTMENT_OTHER): Payer: BLUE CROSS/BLUE SHIELD

## 2018-04-05 ENCOUNTER — Encounter (HOSPITAL_BASED_OUTPATIENT_CLINIC_OR_DEPARTMENT_OTHER): Payer: Self-pay | Admitting: Emergency Medicine

## 2018-04-05 ENCOUNTER — Other Ambulatory Visit: Payer: Self-pay

## 2018-04-05 ENCOUNTER — Emergency Department (HOSPITAL_BASED_OUTPATIENT_CLINIC_OR_DEPARTMENT_OTHER)
Admission: EM | Admit: 2018-04-05 | Discharge: 2018-04-06 | Disposition: A | Discharge status: Home / Self Care | Payer: BLUE CROSS/BLUE SHIELD | Attending: Emergency Medicine | Admitting: Emergency Medicine

## 2018-04-05 DIAGNOSIS — Z79899 Other long term (current) drug therapy: Secondary | ICD-10-CM | POA: Diagnosis not present

## 2018-04-05 DIAGNOSIS — R0981 Nasal congestion: Secondary | ICD-10-CM

## 2018-04-05 DIAGNOSIS — M79605 Pain in left leg: Secondary | ICD-10-CM | POA: Diagnosis not present
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
## 2018-04-05 NOTE — ED Notes (Signed)
ED Provider at bedside. 

## 2018-04-06 MED ORDER — DICLOFENAC SODIUM 3 % TD GEL: 2.0000 g | Freq: Two times a day (BID) | TRANSDERMAL | 0 refills | Status: DC

## 2018-04-06 MED ORDER — FLUTICASONE PROPIONATE 50 MCG/ACT NA SUSP: 1.0000 | Freq: Every day | NASAL | 2 refills | Status: DC

## 2018-04-06 NOTE — Discharge Instructions (Addendum)
Please see the information and instructions below regarding your visit.  Your diagnoses today include:  1. Left leg pain   2. Nasal congestion     Tests performed today include: See side panel of your discharge paperwork for testing performed today. Vital signs are listed at the bottom of these instructions.   The x-ray of your leg shows arthritis.  The ultrasound of your leg shows no evidence of blood clot.  Medications prescribed:    Take any prescribed medications only as prescribed, and any over the counter medications only as directed on the packaging.  You may take Flonase, 1 puff in each nostril once daily in the morning.  This will help with the nasal congestion.  You may apply a thin application of diclofenac gel to your calf daily.   Home care instructions:  Please follow any educational materials contained in this packet.   Follow-up instructions: Please follow-up with your primary care provider as needed for further evaluation of your symptoms if they are not completely improved.   Return instructions:  Please return to the Emergency Department if you experience worsening symptoms.  Please return to the emergency department if you develop any worsening pain, swelling of the leg, redness of the leg, fever greater than 100.4, or chills. Please return if you have any other emergent concerns.  Additional Information:   Your vital signs today were: BP (!) 132/93 (BP Location: Left Arm)    Pulse 75    Temp 98 F (36.7 C) (Oral)    Resp 18    Ht 6\' 1"  (1.854 m)    Wt 98.4 kg    SpO2 100%    BMI 28.63 kg/m  If your blood pressure (BP) was elevated on multiple readings during this visit above 130 for the top number or above 80 for the bottom number, please have this repeated by your primary care provider within one month. --------------  Thank you for allowing [place] to participate in your care today.

## 2018-04-06 NOTE — ED Provider Notes (Signed)
[facility] EMERGENCY DEPARTMENT Provider Note   CSN: [id number] Arrival date & time: 04/05/18  1912     History   Chief Complaint Chief Complaint  Patient presents with  . Leg Injury    HPI Gregory Wong is a 57 y.o. male.  HPI   Patient is a 57 year old male with no significant past medical history presenting for left calf pain rating up his left leg.  He also notes that he has had some nasal congestion.  Patient reports that the left calf pain is been occurring for approximately 1 month.  He reports that it is worse in the morning and feels sharp and crampy.  He reports that it "shoots up the left side of his leg to his hip.  He reports it is intermittent in nature. Patient denies any swelling, redness, recent immobilization, hospitalization, hormone use, recent surgery, cancer treatment, history DVT/PE.  Patient denies known trauma.  No remedies tried for symptoms.  History reviewed. No pertinent past medical history.  There are no active problems to display for this patient.   Past Surgical History:  Procedure Laterality Date  . ANKLE ARTHROSCOPY    . CHOLECYSTECTOMY          Home Medications    Prior to Admission medications   Medication Sig Start Date End Date Taking? Authorizing Provider  acetaminophen (TYLENOL) 500 MG tablet Take 1 tablet (500 mg total) by mouth every 6 (six) hours as needed. 09/04/17   Law, Waylan Boga, PA-C  albuterol (PROVENTIL HFA;VENTOLIN HFA) 108 (90 BASE) MCG/ACT inhaler Inhale 1-2 puffs into the lungs every 6 (six) hours as needed for wheezing or shortness of breath. 02/25/14   Rancour, Jeannett Senior, MD  diphenhydrAMINE (BENADRYL) 25 MG tablet Take 1 tablet (25 mg total) by mouth every 6 (six) hours. 02/25/14   Rancour, Jeannett Senior, MD  HYDROcodone-acetaminophen (NORCO) 5-325 MG per tablet Take 2 tablets by mouth every 4 (four) hours as needed for pain. 03/12/13   Geoffery Lyons, MD  hydrocortisone cream 1 % Apply to affected area 2 times  daily 02/25/14   Rancour, Jeannett Senior, MD  ibuprofen (ADVIL,MOTRIN) 600 MG tablet Take 1 tablet (600 mg total) by mouth every 6 (six) hours as needed. 09/04/17   Law, Waylan Boga, PA-C  methocarbamol (ROBAXIN) 500 MG tablet Take 1 tablet (500 mg total) by mouth 2 (two) times daily. 09/04/17   Law, Waylan Boga, PA-C  naproxen (NAPROSYN) 375 MG tablet Take 1 tablet (375 mg total) by mouth 2 (two) times daily. 12/18/14   Palumbo, April, MD    Family History No family history on file.  Social History Social History   Tobacco Use  . Smoking status: Never Smoker  . Smokeless tobacco: Never Used  Substance Use Topics  . Alcohol use: No  . Drug use: No     Allergies   Morphine and related   Review of Systems Review of Systems  Constitutional: Negative for chills and fever.  Cardiovascular: Negative for leg swelling.  Musculoskeletal: Positive for arthralgias and myalgias. Negative for joint swelling.     Physical Exam Updated Vital Signs BP (!) 132/93 (BP Location: Left Arm)   Pulse 75   Temp 98 F (36.7 C) (Oral)   Resp 18   Ht 6\' 1"  (1.854 m)   Wt 98.4 kg   SpO2 100%   BMI 28.63 kg/m   Physical Exam  Constitutional: He appears well-developed and well-nourished. No distress.  Sitting comfortably in bed.  HENT:  Head: Normocephalic  and atraumatic.  Eyes: Conjunctivae are normal. Right eye exhibits no discharge. Left eye exhibits no discharge.  EOMs normal to gross examination.  Neck: Normal range of motion.  Cardiovascular: Normal rate and regular rhythm.  Intact, 2+ DP pulse of LLE.   Pulmonary/Chest:  Normal respiratory effort. Patient converses comfortably. No audible wheeze or stridor.  Abdominal: He exhibits no distension.  Musculoskeletal: Normal range of motion.  Patient has calf tenderness to palpation of the left posterior calf. No edema. Left knee with tenderness to palpation of left lateral knee. Full ROM. No joint line tenderness. No joint effusion or swelling  appreciated. No abnormal alignment or patellar mobility. No bruising, erythema or warmth overlaying the joint. 2+ DP pulses bilaterally. All compartments are soft. Sensation intact distal to injury.   Neurological: He is alert.  Cranial nerves intact to gross observation. Patient moves extremities without difficulty.  Skin: Skin is warm and dry. He is not diaphoretic.  Psychiatric: He has a normal mood and affect. His behavior is normal. Judgment and thought content normal.  Nursing note and vitals reviewed.    ED Treatments / Results  Labs (all labs ordered are listed, but only abnormal results are displayed) Labs Reviewed - No data to display  EKG None  Radiology Dg Tibia/fibula Left  Result Date: 04/05/2018 CLINICAL DATA:  57 y/o M; sudden onset left tibia fibula pain. No known injury. EXAM: LEFT TIBIA AND FIBULA - 2 VIEW COMPARISON:  None. FINDINGS: No acute fracture or dislocation. Mild loss of the medial femorotibial compartment joint space. Small linear densities in the soft tissues medial to the knee joint, question prior knee surgery. Ankle joint is well maintained. IMPRESSION: 1. No acute osseous abnormality. 2. Mild osteoarthrosis of the knee with loss of the medial femorotibial compartment joint space. Electronically Signed   By: Mitzi Hansen M.D.   On: 04/05/2018 20:49   US Venous Img Lower Unilateral Left  Result Date: 04/05/2018 CLINICAL DATA:  Left calf pain for 1 month EXAM: LEFT LOWER EXTREMITY VENOUS DOPPLER ULTRASOUND TECHNIQUE: Gray-scale sonography with graded compression, as well as color Doppler and duplex ultrasound were performed to evaluate the lower extremity deep venous systems from the level of the common femoral vein and including the common femoral, femoral, profunda femoral, popliteal and calf veins including the posterior tibial, peroneal and gastrocnemius veins when visible. The superficial great saphenous vein was also interrogated.  Spectral Doppler was utilized to evaluate flow at rest and with distal augmentation maneuvers in the common femoral, femoral and popliteal veins. COMPARISON:  None. FINDINGS: Contralateral Common Femoral Vein: Respiratory phasicity is normal and symmetric with the symptomatic side. No evidence of thrombus. Normal compressibility. Common Femoral Vein: No evidence of thrombus. Normal compressibility, respiratory phasicity and response to augmentation. Saphenofemoral Junction: No evidence of thrombus. Normal compressibility and flow on color Doppler imaging. Profunda Femoral Vein: No evidence of thrombus. Normal compressibility and flow on color Doppler imaging. Femoral Vein: No evidence of thrombus. Normal compressibility, respiratory phasicity and response to augmentation. Popliteal Vein: No evidence of thrombus. Normal compressibility, respiratory phasicity and response to augmentation. Calf Veins: No evidence of thrombus. Normal compressibility and flow on color Doppler imaging. Superficial Great Saphenous Vein: No evidence of thrombus. Normal compressibility. Venous Reflux:  None. Other Findings:  None. IMPRESSION: No evidence of deep venous thrombosis. Electronically Signed   By: Charlett Nose M.D.   On: 04/05/2018 23:38    Procedures Procedures (including critical care time)  Medications Ordered in ED Medications -  No data to display   Initial Impression / Assessment and Plan / ED Course  I have reviewed the triage vital signs and the nursing notes.  Pertinent labs & imaging results that were available during my care of the patient were reviewed by me and considered in my medical decision making (see chart for details).     Patient is well-appearing and neurovascularly intact in the left lower extremity.  Patient with calf tenderness for approximately one month.  Feel that this is likely muscular skeletal cramping in nature, however given age greater than 45, will perform DVT study.  DVT study  is negative for acute thrombus.  X-ray of left tibia and fibula demonstrate mild osteoarthritis of the knee, but no acute abnormality.  Recommended diclofenac gel the patient.    Patient is also noted needles little congestion for the past couple weeks.  Recommended Flonase.  Return precautions were given for any fever, chills, increasing swelling, erythema of the lower extremity, or pain.  Patient is in understanding and agrees with the plan of care.  Final Clinical Impressions(s) / ED Diagnoses   Final diagnoses:  Left leg pain  Nasal congestion    ED Discharge Orders         Ordered    fluticasone (FLONASE) 50 MCG/ACT nasal spray  Daily     04/06/18 0009    Diclofenac Sodium 3 % GEL  2 times daily     04/06/18 0009           Elisha Ponder, PA-C 04/06/18 0039    Vanetta Mulders, MD 04/07/18 [phone]

## 2019-06-26 ENCOUNTER — Other Ambulatory Visit: Payer: Self-pay

## 2019-06-26 ENCOUNTER — Emergency Department (HOSPITAL_BASED_OUTPATIENT_CLINIC_OR_DEPARTMENT_OTHER)
Admission: EM | Admit: 2019-06-26 | Discharge: 2019-06-26 | Disposition: A | Discharge status: Home / Self Care | Payer: BC Managed Care – PPO | Attending: Emergency Medicine | Admitting: Emergency Medicine

## 2019-06-26 ENCOUNTER — Emergency Department (HOSPITAL_BASED_OUTPATIENT_CLINIC_OR_DEPARTMENT_OTHER): Payer: BC Managed Care – PPO

## 2019-06-26 DIAGNOSIS — Z885 Allergy status to narcotic agent status: Secondary | ICD-10-CM | POA: Diagnosis not present

## 2019-06-26 DIAGNOSIS — Z79899 Other long term (current) drug therapy: Secondary | ICD-10-CM | POA: Diagnosis not present

## 2019-06-26 DIAGNOSIS — R519 Headache, unspecified: Secondary | ICD-10-CM | POA: Diagnosis not present

## 2019-06-26 DIAGNOSIS — R0789 Other chest pain: Secondary | ICD-10-CM | POA: Insufficient documentation

## 2019-06-26 LAB — CBC WITH DIFFERENTIAL/PLATELET
Abs Immature Granulocytes: 0.01 10*3/uL (ref 0.00–0.07)
Basophils Absolute: 0.1 10*3/uL (ref 0.0–0.1)
Basophils Relative: 1 %
Eosinophils Absolute: 0.2 10*3/uL (ref 0.0–0.5)
Eosinophils Relative: 3 %
HCT: 48.1 % (ref 39.0–52.0)
Hemoglobin: 15.9 g/dL (ref 13.0–17.0)
Immature Granulocytes: 0 %
Lymphocytes Relative: 36 %
Lymphs Abs: 2.7 10*3/uL (ref 0.7–4.0)
MCH: 30.6 pg (ref 26.0–34.0)
MCHC: 33.1 g/dL (ref 30.0–36.0)
MCV: 92.7 fL (ref 80.0–100.0)
Monocytes Absolute: 0.6 10*3/uL (ref 0.1–1.0)
Monocytes Relative: 9 %
Neutro Abs: 3.8 10*3/uL (ref 1.7–7.7)
Neutrophils Relative %: 51 %
Platelets: 307 10*3/uL (ref 150–400)
RBC: 5.19 MIL/uL (ref 4.22–5.81)
RDW: 12.6 % (ref 11.5–15.5)
WBC: 7.4 10*3/uL (ref 4.0–10.5)
nRBC: 0 % (ref 0.0–0.2)

## 2019-06-26 LAB — HEPATIC FUNCTION PANEL
ALT: 20 U/L (ref 0–44)
AST: 23 U/L (ref 15–41)
Albumin: 4.2 g/dL (ref 3.5–5.0)
Alkaline Phosphatase: 71 U/L (ref 38–126)
Bilirubin, Direct: 0.1 mg/dL (ref 0.0–0.2)
Indirect Bilirubin: 1 mg/dL — ABNORMAL HIGH (ref 0.3–0.9)
Total Bilirubin: 1.1 mg/dL (ref 0.3–1.2)
Total Protein: 8.3 g/dL — ABNORMAL HIGH (ref 6.5–8.1)

## 2019-06-26 LAB — BASIC METABOLIC PANEL
Anion gap: 7 (ref 5–15)
BUN: 18 mg/dL (ref 6–20)
CO2: 29 mmol/L (ref 22–32)
Calcium: 9.3 mg/dL (ref 8.9–10.3)
Chloride: 100 mmol/L (ref 98–111)
Creatinine, Ser: 1 mg/dL (ref 0.61–1.24)
GFR calc Af Amer: >60 mL/min (ref >60)
GFR calc non Af Amer: >60 mL/min (ref >60)
Glucose, Bld: 99 mg/dL (ref 70–99)
Potassium: 3.8 mmol/L (ref 3.5–5.1)
Sodium: 136 mmol/L (ref 135–145)

## 2019-06-26 LAB — LIPASE, BLOOD: Lipase: 24 U/L (ref 11–51)

## 2019-06-26 LAB — TROPONIN I (HIGH SENSITIVITY)
Troponin I (High Sensitivity): 3 ng/L (ref <18)
Troponin I (High Sensitivity): 3 ng/L (ref <18)

## 2019-06-26 MED ORDER — ACETAMINOPHEN 325 MG PO TABS
650.0000 mg | ORAL_TABLET | Freq: Once | ORAL | Status: AC
  Administered 2019-06-26: 650 mg via ORAL
  Filled 2019-06-26: qty 2

## 2019-06-26 NOTE — Discharge Instructions (Addendum)
Cardiac work-up is overall unremarkable.  Follow-up with your primary care doctor as discussed.

## 2019-06-26 NOTE — ED Provider Notes (Signed)
[place] [facility] DEPART[facility] Provider Note   CSN: [id number] Arrival date & time: 06/26/19  1124     History Chief Complaint  Patient presents with  . Chest Pain    Gregory Wong is a 59 y.o. male.  The history is provided by the patient.  Chest Pain Pain location:  L chest Pain quality: aching   Pain radiates to:  Does not radiate Pain severity:  Mild Onset quality:  Gradual Duration:  2 days Timing:  Intermittent Progression:  Waxing and waning Context: movement and raising an arm   Relieved by:  Nothing Worsened by:  Nothing Associated symptoms: headache   Associated symptoms: no abdominal pain, no anxiety, no back pain, no cough, no diaphoresis, no fever, no palpitations, no shortness of breath, no vomiting and no weakness   Risk factors: no coronary artery disease, no diabetes mellitus, no high cholesterol, no hypertension, not male, no prior DVT/PE and no smoking        No past medical history on file.  There are no problems to display for this patient.   Past Surgical History:  Procedure Laterality Date  . ANKLE ARTHROSCOPY    . CHOLECYSTECTOMY         No family history on file.  Social History   Tobacco Use  . Smoking status: Never Smoker  . Smokeless tobacco: Never Used  Substance Use Topics  . Alcohol use: No  . Drug use: No    Home Medications Prior to Admission medications   Medication Sig Start Date End Date Taking? Authorizing Provider  acetaminophen (TYLENOL) 500 MG tablet Take 1 tablet (500 mg total) by mouth every 6 (six) hours as needed. 09/04/17   Law, Bea Graff, PA-C  albuterol (PROVENTIL HFA;VENTOLIN HFA) 108 (90 BASE) MCG/ACT inhaler Inhale 1-2 puffs into the lungs every 6 (six) hours as needed for wheezing or shortness of breath. 02/25/14   Rancour, Annie Main, MD  Diclofenac Sodium 3 % GEL Place 2 g onto the skin 2 (two) times daily. 04/06/18   Langston Masker B, PA-C  diphenhydrAMINE (BENADRYL) 25 MG tablet Take 1  tablet (25 mg total) by mouth every 6 (six) hours. 02/25/14   Rancour, Annie Main, MD  fluticasone (FLONASE) 50 MCG/ACT nasal spray Place 1 spray into both nostrils daily. 04/06/18   Langston Masker B, PA-C  HYDROcodone-acetaminophen (NORCO) 5-325 MG per tablet Take 2 tablets by mouth every 4 (four) hours as needed for pain. 03/12/13   Veryl Speak, MD  hydrocortisone cream 1 % Apply to affected area 2 times daily 02/25/14   Rancour, Annie Main, MD  ibuprofen (ADVIL,MOTRIN) 600 MG tablet Take 1 tablet (600 mg total) by mouth every 6 (six) hours as needed. 09/04/17   Law, Bea Graff, PA-C  methocarbamol (ROBAXIN) 500 MG tablet Take 1 tablet (500 mg total) by mouth 2 (two) times daily. 09/04/17   Law, Bea Graff, PA-C  naproxen (NAPROSYN) 375 MG tablet Take 1 tablet (375 mg total) by mouth 2 (two) times daily. 12/18/14   Palumbo, April, MD    Allergies    Morphine and related  Review of Systems   Review of Systems  Constitutional: Negative for chills, diaphoresis and fever.  HENT: Negative for ear pain and sore throat.   Eyes: Negative for pain and visual disturbance.  Respiratory: Negative for cough and shortness of breath.   Cardiovascular: Positive for chest pain. Negative for palpitations.  Gastrointestinal: Negative for abdominal pain and vomiting.  Genitourinary: Negative for dysuria and hematuria.  Musculoskeletal: Negative for arthralgias and back pain.  Skin: Negative for color change and rash.  Neurological: Positive for headaches. Negative for seizures, syncope and weakness.  All other systems reviewed and are negative.   Physical Exam Updated Vital Signs  ED Triage Vitals  Enc Vitals Group     BP 06/26/19 1149 120/86     Pulse Rate 06/26/19 1149 62     Resp 06/26/19 1149 18     Temp 06/26/19 1149 98.1 F (36.7 C)     Temp Source 06/26/19 1149 Oral     SpO2 06/26/19 1149 99 %     Weight 06/26/19 1150 209 lb (94.8 kg)     Height 06/26/19 1150 6\' 4"  (1.93 m)     Head  Circumference --      Peak Flow --      Pain Score 06/26/19 1150 2     Pain Loc --      Pain Edu? --      Excl. in GC? --     Physical Exam Vitals and nursing note reviewed.  Constitutional:      General: He is not in acute distress.    Appearance: He is well-developed. He is not ill-appearing.  HENT:     Head: Normocephalic and atraumatic.  Eyes:     Conjunctiva/sclera: Conjunctivae normal.     Pupils: Pupils are equal, round, and reactive to light.  Cardiovascular:     Rate and Rhythm: Normal rate and regular rhythm.     Pulses:          Radial pulses are 2+ on the right side and 2+ on the left side.     Heart sounds: Normal heart sounds. No murmur.  Pulmonary:     Effort: Pulmonary effort is normal. No respiratory distress.     Breath sounds: Normal breath sounds. No decreased breath sounds, wheezing, rhonchi or rales.  Abdominal:     Palpations: Abdomen is soft.     Tenderness: There is no abdominal tenderness.  Musculoskeletal:        General: Normal range of motion.     Cervical back: Normal range of motion and neck supple.  Skin:    General: Skin is warm and dry.     Capillary Refill: Capillary refill takes less than 2 seconds.  Neurological:     General: No focal deficit present.     Mental Status: He is alert.  Psychiatric:        Mood and Affect: Mood normal.     ED Results / Procedures / Treatments   Labs (all labs ordered are listed, but only abnormal results are displayed) Labs Reviewed  HEPATIC FUNCTION PANEL - Abnormal; Notable for the following components:      Result Value   Total Protein 8.3 (*)    Indirect Bilirubin 1.0 (*)    All other components within normal limits  CBC WITH DIFFERENTIAL/PLATELET  BASIC METABOLIC PANEL  LIPASE, BLOOD  TROPONIN I (HIGH SENSITIVITY)  TROPONIN I (HIGH SENSITIVITY)    EKG EKG Interpretation  Date/Time:  Monday June 26 2019 11:48:14 EST Ventricular Rate:  70 PR Interval:    QRS Duration: 97 QT  Interval:  385 QTC Calculation: 416 R Axis:   -22 Text Interpretation: Sinus rhythm Borderline left axis deviation Confirmed by [id number] [phone]) on 06/26/2019 11:50:36 AM   Radiology DG Chest Portable 1 View  Result Date: 06/26/2019 CLINICAL DATA:  Chest pain. EXAM: PORTABLE CHEST 1 VIEW COMPARISON:  Chest x-ray dated March 11, 2017. FINDINGS: The heart size and mediastinal contours are within normal limits. Both lungs are clear. The visualized skeletal structures are unremarkable. IMPRESSION: No active disease. Electronically Signed   By: Obie Dredge M.D.   On: 06/26/2019 12:04    Procedures Procedures (including critical care time)  Medications Ordered in ED Medications  acetaminophen (TYLENOL) tablet 650 mg (650 mg Oral Given 06/26/19 1215)    ED Course  I have reviewed the triage vital signs and the nursing notes.  Pertinent labs & imaging results that were available during my care of the patient were reviewed by me and considered in my medical decision making (see chart for details).    MDM Rules/Calculators/A&P  Gregory Wong is a 59 year old male with no significant medical history presents the ED with chest pain.  Patient with normal vitals.  No fever.  Pain on and off for the last 2 days.  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  Has had some headaches but none currently.  States that his headaches appear may be related to his left knee pain.  EKG shows sinus rhythm.  No ischemic changes.  Does not have any PE risk factors and doubt PE.  Does not have any infectious symptoms.  Chest x-ray shows no signs of pneumonia, pneumothorax, pleural effusion.  Will evaluate for ACS with troponins and basic labs.  Otherwise does not appear to be respiratory process.  Possibly MSK reflux.   No significant anemia, electrolyte abnormality, kidney injury.  Gallbladder and liver enzymes normal.  Doubt hepatobiliary process including cholecystitis.  Lipase normal doubt pancreatitis.  Troponins  negative.  Less likely cardiac process. Heart score 1. Likely MSK reflux.  Recommend follow-up with primary care doctor discharged from ED in good condition.  This chart was dictated using voice recognition software.  Despite best efforts to proofread,  errors can occur which can change the documentation meaning.   Final Clinical Impression(s) / ED Diagnoses Final diagnoses:  Atypical chest pain    Rx / DC Orders ED Discharge Orders    None       Virgina Norfolk, DO 06/26/19 1432

## 2019-06-26 NOTE — ED Notes (Signed)
Patient verbalizes understanding of discharge instructions. Opportunity for questioning and answers were provided. Armband removed by staff, pt discharged from ED.  

## 2019-06-26 NOTE — ED Notes (Signed)
Called patient's wife, Kriste Basque, with an update.

## 2019-06-26 NOTE — ED Triage Notes (Signed)
Patient here from doctor's office for a check up and the nurse sent him here for a possible heart attack. C/O chest pain 2/10, states it started yesterday with 10/10 pain radiating towards left shoulder while he was sitting down playing a computer game. No SOB, hx of stroke last year. Also c/o of HA with tingling. No weakness noted.

## 2020-07-11 ENCOUNTER — Observation Stay (HOSPITAL_COMMUNITY): Payer: BC Managed Care – PPO

## 2020-07-11 ENCOUNTER — Emergency Department (HOSPITAL_BASED_OUTPATIENT_CLINIC_OR_DEPARTMENT_OTHER): Payer: BC Managed Care – PPO

## 2020-07-11 ENCOUNTER — Emergency Department (HOSPITAL_COMMUNITY): Payer: BC Managed Care – PPO

## 2020-07-11 ENCOUNTER — Other Ambulatory Visit: Payer: Self-pay

## 2020-07-11 ENCOUNTER — Encounter (HOSPITAL_BASED_OUTPATIENT_CLINIC_OR_DEPARTMENT_OTHER): Payer: Self-pay

## 2020-07-11 ENCOUNTER — Observation Stay (HOSPITAL_BASED_OUTPATIENT_CLINIC_OR_DEPARTMENT_OTHER)
Admission: EM | Admit: 2020-07-11 | Discharge: 2020-07-12 | Disposition: A | Discharge status: Home / Self Care | Payer: BC Managed Care – PPO | Attending: Internal Medicine | Admitting: Internal Medicine

## 2020-07-11 DIAGNOSIS — U071 COVID-19: Secondary | ICD-10-CM

## 2020-07-11 DIAGNOSIS — R42 Dizziness and giddiness: Secondary | ICD-10-CM | POA: Insufficient documentation

## 2020-07-11 DIAGNOSIS — Z8673 Personal history of transient ischemic attack (TIA), and cerebral infarction without residual deficits: Secondary | ICD-10-CM | POA: Diagnosis not present

## 2020-07-11 DIAGNOSIS — Z79899 Other long term (current) drug therapy: Secondary | ICD-10-CM | POA: Diagnosis not present

## 2020-07-11 DIAGNOSIS — I639 Cerebral infarction, unspecified: Secondary | ICD-10-CM | POA: Diagnosis not present

## 2020-07-11 DIAGNOSIS — R519 Headache, unspecified: Secondary | ICD-10-CM | POA: Diagnosis present

## 2020-07-11 HISTORY — DX: Cerebral infarction, unspecified: I63.9

## 2020-07-11 HISTORY — DX: Gastric ulcer, unspecified as acute or chronic, without hemorrhage or perforation: K25.9

## 2020-07-11 LAB — CBC WITH DIFFERENTIAL/PLATELET
Abs Immature Granulocytes: 0.03 10*3/uL (ref 0.00–0.07)
Basophils Absolute: 0.1 10*3/uL (ref 0.0–0.1)
Basophils Relative: 1 %
Eosinophils Absolute: 0.3 10*3/uL (ref 0.0–0.5)
Eosinophils Relative: 4 %
HCT: 44.5 % (ref 39.0–52.0)
Hemoglobin: 15.3 g/dL (ref 13.0–17.0)
Immature Granulocytes: 0 %
Lymphocytes Relative: 33 %
Lymphs Abs: 2.8 10*3/uL (ref 0.7–4.0)
MCH: 31.4 pg (ref 26.0–34.0)
MCHC: 34.4 g/dL (ref 30.0–36.0)
MCV: 91.4 fL (ref 80.0–100.0)
Monocytes Absolute: 0.8 10*3/uL (ref 0.1–1.0)
Monocytes Relative: 10 %
Neutro Abs: 4.6 10*3/uL (ref 1.7–7.7)
Neutrophils Relative %: 52 %
Platelets: 320 10*3/uL (ref 150–400)
RBC: 4.87 MIL/uL (ref 4.22–5.81)
RDW: 12.2 % (ref 11.5–15.5)
WBC: 8.6 10*3/uL (ref 4.0–10.5)
nRBC: 0 % (ref 0.0–0.2)

## 2020-07-11 LAB — COMPREHENSIVE METABOLIC PANEL
ALT: 35 U/L (ref 0–44)
AST: 27 U/L (ref 15–41)
Albumin: 3.9 g/dL (ref 3.5–5.0)
Alkaline Phosphatase: 65 U/L (ref 38–126)
Anion gap: 8 (ref 5–15)
BUN: 11 mg/dL (ref 6–20)
CO2: 29 mmol/L (ref 22–32)
Calcium: 9.4 mg/dL (ref 8.9–10.3)
Chloride: 101 mmol/L (ref 98–111)
Creatinine, Ser: 0.98 mg/dL (ref 0.61–1.24)
GFR, Estimated: >60 mL/min (ref >60)
Glucose, Bld: 112 mg/dL — ABNORMAL HIGH (ref 70–99)
Potassium: 4 mmol/L (ref 3.5–5.1)
Sodium: 138 mmol/L (ref 135–145)
Total Bilirubin: 0.8 mg/dL (ref 0.3–1.2)
Total Protein: 7.4 g/dL (ref 6.5–8.1)

## 2020-07-11 LAB — TSH: TSH: 0.621 u[IU]/mL (ref 0.350–4.500)

## 2020-07-11 LAB — PROTIME-INR
INR: 1 (ref 0.8–1.2)
Prothrombin Time: 13 seconds (ref 11.4–15.2)

## 2020-07-11 LAB — D-DIMER, QUANTITATIVE: D-Dimer, Quant: 0.29 ug/mL-FEU (ref 0.00–0.50)

## 2020-07-11 LAB — SARS CORONAVIRUS 2 BY RT PCR (HOSPITAL ORDER, PERFORMED IN ~~LOC~~ HOSPITAL LAB): SARS Coronavirus 2: POSITIVE — AB

## 2020-07-11 MED ORDER — ATORVASTATIN CALCIUM 40 MG PO TABS
80.0000 mg | ORAL_TABLET | Freq: Every day | ORAL | Status: DC
  Administered 2020-07-12: 80 mg via ORAL
  Filled 2020-07-11: qty 2

## 2020-07-11 MED ORDER — IOHEXOL 350 MG/ML SOLN
75.0000 mL | Freq: Once | INTRAVENOUS | Status: AC | PRN
  Administered 2020-07-11: 75 mL via INTRAVENOUS

## 2020-07-11 MED ORDER — ASPIRIN EC 325 MG PO TBEC
325.0000 mg | DELAYED_RELEASE_TABLET | Freq: Every day | ORAL | Status: DC
  Administered 2020-07-12: 325 mg via ORAL
  Filled 2020-07-11: qty 1

## 2020-07-11 MED ORDER — ACETAMINOPHEN 160 MG/5ML PO SOLN: 650.0000 mg | ORAL | Status: DC | PRN

## 2020-07-11 MED ORDER — STROKE: EARLY STAGES OF RECOVERY BOOK: Freq: Once | Status: DC

## 2020-07-11 MED ORDER — ENOXAPARIN SODIUM 40 MG/0.4ML ~~LOC~~ SOLN: 40.0000 mg | SUBCUTANEOUS | Status: DC

## 2020-07-11 MED ORDER — ALBUTEROL SULFATE HFA 108 (90 BASE) MCG/ACT IN AERS: 1.0000 | INHALATION_SPRAY | Freq: Four times a day (QID) | RESPIRATORY_TRACT | Status: DC | PRN

## 2020-07-11 MED ORDER — ACETAMINOPHEN 650 MG RE SUPP: 650.0000 mg | RECTAL | Status: DC | PRN

## 2020-07-11 MED ORDER — ACETAMINOPHEN 325 MG PO TABS: 650.0000 mg | ORAL_TABLET | ORAL | Status: DC | PRN

## 2020-07-11 MED ORDER — ONDANSETRON HCL 4 MG/2ML IJ SOLN
4.0000 mg | Freq: Once | INTRAMUSCULAR | Status: AC
  Administered 2020-07-11: 4 mg via INTRAVENOUS
  Filled 2020-07-11: qty 2

## 2020-07-11 MED ORDER — MECLIZINE HCL 25 MG PO TABS
50.0000 mg | ORAL_TABLET | Freq: Once | ORAL | Status: AC
  Administered 2020-07-11: 50 mg via ORAL
  Filled 2020-07-11: qty 2

## 2020-07-11 MED ORDER — CLOPIDOGREL BISULFATE 75 MG PO TABS
75.0000 mg | ORAL_TABLET | Freq: Every day | ORAL | Status: DC
  Administered 2020-07-12: 75 mg via ORAL
  Filled 2020-07-11: qty 1

## 2020-07-11 NOTE — ED Notes (Signed)
Patient transported to CT 

## 2020-07-11 NOTE — H&P (Signed)
History and Physical   Gregory Wong ZOX:[id number] DOB: 06-27-1960 DOA: 07/11/2020  PCP: Jolene ProvostHaimes, David M, MD   Patient coming from: Home  Chief Complaint: Dizziness, blurry vision  HPI: Gregory GeraldsSidney Lyness is a 60 y.o. male with medical history significant of CVA, gastric ulcer who presented with 1 day of blurry vision and some recent dizziness.  Patient reports that last night he had a room spinning sensation after watching a movie.  He was dizzy and unsteady on his feet at that time.  He also had associated headache and sweating.  He states he also had some blurry vision which persisted for a couple of hours. He denies fevers, chills, cough, shortness of breath, constipation, nausea, weakness.   ED Course: Vital signs in ED significant for blood pressure 140 systolic.  Lab work-up showed BMP within normal limits, LFTs within normal limits, CBC within normal limits, PT, INR within normal's.  D-dimer normal, TSH normal, respiratory panel for flu and Covid positive for Covid.  CT head without acute normality, MR brain showed small acute infarct at the right parietal lobe no hemorrhage.  Neurology was consulted in the ED.  Review of Systems: As per HPI otherwise all other systems reviewed and are negative.  Past Medical History:  Diagnosis Date  . Gastric ulcer   . Stroke [facility](HCC)     Past Surgical History:  Procedure Laterality Date  . ANKLE ARTHROSCOPY    . CHOLECYSTECTOMY      Social History  reports that he has never smoked. He has never used smokeless tobacco. He reports that he does not drink alcohol and does not use drugs.  Allergies  Allergen Reactions  . Baclofen     Other reaction(s): Dizziness (intolerance)  . Metformin And Related     Unknown  . Morphine And Related     Unknown    No family history on file. Reviewed on admission.  Prior to Admission medications   Medication Sig Start Date End Date Taking? Authorizing Provider  acetaminophen (TYLENOL) 500 MG tablet Take  1 tablet (500 mg total) by mouth every 6 (six) hours as needed. 09/04/17   Law, Waylan BogaAlexandra M, PA-C  albuterol (PROVENTIL HFA;VENTOLIN HFA) 108 (90 BASE) MCG/ACT inhaler Inhale 1-2 puffs into the lungs every 6 (six) hours as needed for wheezing or shortness of breath. 02/25/14   Rancour, Jeannett SeniorStephen, MD  Diclofenac Sodium 3 % GEL Place 2 g onto the skin 2 (two) times daily. 04/06/18   Aviva KluverMurray, Alyssa B, PA-C  diphenhydrAMINE (BENADRYL) 25 MG tablet Take 1 tablet (25 mg total) by mouth every 6 (six) hours. 02/25/14   Rancour, Jeannett SeniorStephen, MD  fluticasone (FLONASE) 50 MCG/ACT nasal spray Place 1 spray into both nostrils daily. 04/06/18   Aviva KluverMurray, Alyssa B, PA-C  HYDROcodone-acetaminophen (NORCO) 5-325 MG per tablet Take 2 tablets by mouth every 4 (four) hours as needed for pain. 03/12/13   Geoffery Lyonselo, Douglas, MD  hydrocortisone cream 1 % Apply to affected area 2 times daily 02/25/14   Rancour, Jeannett SeniorStephen, MD  ibuprofen (ADVIL,MOTRIN) 600 MG tablet Take 1 tablet (600 mg total) by mouth every 6 (six) hours as needed. 09/04/17   Law, Waylan BogaAlexandra M, PA-C  methocarbamol (ROBAXIN) 500 MG tablet Take 1 tablet (500 mg total) by mouth 2 (two) times daily. 09/04/17   Law, Waylan BogaAlexandra M, PA-C  naproxen (NAPROSYN) 375 MG tablet Take 1 tablet (375 mg total) by mouth 2 (two) times daily. 12/18/14   Palumbo, April, MD    Physical Exam: Vitals:  07/11/20 1600 07/11/20 1618 07/11/20 1748 07/11/20 2159  BP:  135/85 (!) 148/93 137/81  Pulse: 64 72 78 72  Resp: 14 11 15 16   Temp:   98.6 F (37 C)   TempSrc:   Oral   SpO2: 97% 98% 97% 100%  Weight:      Height:       Physical Exam Constitutional:      General: He is not in acute distress.    Appearance: Normal appearance.  HENT:     Head: Normocephalic and atraumatic.     Mouth/Throat:     Mouth: Mucous membranes are moist.     Pharynx: Oropharynx is clear.  Eyes:     Extraocular Movements: Extraocular movements intact.     Pupils: Pupils are equal, round, and reactive to light.   Cardiovascular:     Rate and Rhythm: Normal rate and regular rhythm.     Pulses: Normal pulses.     Heart sounds: Normal heart sounds.  Pulmonary:     Effort: Pulmonary effort is normal. No respiratory distress.     Breath sounds: Rales (Trace on left) present.  Abdominal:     General: Bowel sounds are normal. There is no distension.     Palpations: Abdomen is soft.     Tenderness: There is no abdominal tenderness.  Musculoskeletal:        General: No swelling or deformity.  Skin:    General: Skin is warm and dry.  Neurological:     Comments: Mental Status: Patient is awake, alert, oriented x3 No signs of aphasia or neglect Cranial Nerves: II: Pupils equal, round, and reactive to light.  III,IV, VI: EOMI without ptosis or diploplia.  V: Facial sensation is symmetric tolight touch VII: Facial movement is symmetric.  VIII: hearing is intact to voice X: Uvula elevates symmetrically XI: Shoulder shrug is symmetric. XII: tongue is midline without atrophy or fasciculations.  Motor: good effort thorughout, at Least 5/5 bilateral UE, 5/5 bilateral lower extremitiy Sensory: Sensation is grossly intact bilateral UEs & LEs Cerebellar: Finger-Nose intact bilalat    Labs on Admission: I have personally reviewed following labs and imaging studies  CBC: Recent Labs  Lab 07/11/20 1413  WBC 8.6  NEUTROABS 4.6  HGB 15.3  HCT 44.5  MCV 91.4  PLT 320    Basic Metabolic Panel: Recent Labs  Lab 07/11/20 1413  NA 138  K 4.0  CL 101  CO2 29  GLUCOSE 112*  BUN 11  CREATININE 0.98  CALCIUM 9.4    GFR: Estimated Creatinine Clearance: 100.7 mL/min (by C-G formula based on SCr of 0.98 mg/dL).  Liver Function Tests: Recent Labs  Lab 07/11/20 1413  AST 27  ALT 35  ALKPHOS 65  BILITOT 0.8  PROT 7.4  ALBUMIN 3.9    Urine analysis:    Component Value Date/Time   COLORURINE YELLOW 03/12/2013 0800   APPEARANCEUR CLEAR 03/12/2013 0800   LABSPEC 1.023 03/12/2013 0800    PHURINE 6.0 03/12/2013 0800   GLUCOSEU NEGATIVE 03/12/2013 0800   HGBUR SMALL (A) 03/12/2013 0800   BILIRUBINUR NEGATIVE 03/12/2013 0800   KETONESUR NEGATIVE 03/12/2013 0800   PROTEINUR NEGATIVE 03/12/2013 0800   UROBILINOGEN 1.0 03/12/2013 0800   NITRITE NEGATIVE 03/12/2013 0800   LEUKOCYTESUR NEGATIVE 03/12/2013 0800    Radiological Exams on Admission: CT ANGIO HEAD W OR WO CONTRAST  Result Date: 07/11/2020 CLINICAL DATA:  Dizziness, sudden onset EXAM: CT ANGIOGRAPHY HEAD AND NECK TECHNIQUE: Multidetector CT imaging of the  head and neck was performed using the standard protocol during bolus administration of intravenous contrast. Multiplanar CT image reconstructions and MIPs were obtained to evaluate the vascular anatomy. Carotid stenosis measurements (when applicable) are obtained utilizing NASCET criteria, using the distal internal carotid diameter as the denominator. CONTRAST:  50mL OMNIPAQUE IOHEXOL 350 MG/ML SOLN COMPARISON:  None. FINDINGS: CTA NECK FINDINGS SKELETON: There is no bony spinal canal stenosis. No lytic or blastic lesion. OTHER NECK: Normal pharynx, larynx and major salivary glands. No cervical lymphadenopathy. Unremarkable thyroid gland. UPPER CHEST: No pneumothorax or pleural effusion. No nodules or masses. AORTIC ARCH: There is no calcific atherosclerosis of the aortic arch. There is no aneurysm, dissection or hemodynamically significant stenosis of the visualized portion of the aorta. Conventional 3 vessel aortic branching pattern. The visualized proximal subclavian arteries are widely patent. RIGHT CAROTID SYSTEM: No dissection, occlusion or aneurysm. Mild atherosclerotic calcification at the carotid bifurcation without hemodynamically significant stenosis. LEFT CAROTID SYSTEM: No dissection, occlusion or aneurysm. Mild atherosclerotic calcification at the carotid bifurcation without hemodynamically significant stenosis. VERTEBRAL ARTERIES: Codominant configuration. Both  origins are clearly patent. There is no dissection, occlusion or flow-limiting stenosis to the skull base (V1-V3 segments). CTA HEAD FINDINGS POSTERIOR CIRCULATION: --Vertebral arteries: Normal V4 segments. --Inferior cerebellar arteries: Normal. --Basilar artery: Normal. --Superior cerebellar arteries: Normal. --Posterior cerebral arteries (PCA): Normal. ANTERIOR CIRCULATION: --Intracranial internal carotid arteries: Normal. --Anterior cerebral arteries (ACA): Normal. Both A1 segments are present. Patent anterior communicating artery (a-comm). --Middle cerebral arteries (MCA): Normal. VENOUS SINUSES: As permitted by contrast timing, patent. ANATOMIC VARIANTS: None Review of the MIP images confirms the above findings. IMPRESSION: 1. No emergent large vessel occlusion or high-grade stenosis of the intracranial arteries. 2. Mild bilateral carotid bifurcation atherosclerosis without hemodynamically significant stenosis by NASCET criteria. Electronically Signed   By: Deatra Robinson M.D.   On: 07/11/2020 22:00   CT Head Wo Contrast  Result Date: 07/11/2020 CLINICAL DATA:  Dizziness EXAM: CT HEAD WITHOUT CONTRAST TECHNIQUE: Contiguous axial images were obtained from the base of the skull through the vertex without intravenous contrast. COMPARISON:  March 04, 2020 FINDINGS: Brain: No evidence of acute infarction, hemorrhage, hydrocephalus, extra-axial collection or mass lesion/mass effect. Vascular: Vascular calcifications of the carotid siphons. Skull: Normal. Negative for fracture or focal lesion. Sinuses/Orbits: Mucosal thickening of the LEFT sphenoid sinus. Mild mucosal thickening throughout the ethmoid air cells and frontal sinuses. Other: None. IMPRESSION: No acute intracranial abnormality. Electronically Signed   By: Meda Klinefelter MD   On: 07/11/2020 14:18   CT ANGIO NECK W OR WO CONTRAST  Result Date: 07/11/2020 CLINICAL DATA:  Dizziness, sudden onset EXAM: CT ANGIOGRAPHY HEAD AND NECK TECHNIQUE:  Multidetector CT imaging of the head and neck was performed using the standard protocol during bolus administration of intravenous contrast. Multiplanar CT image reconstructions and MIPs were obtained to evaluate the vascular anatomy. Carotid stenosis measurements (when applicable) are obtained utilizing NASCET criteria, using the distal internal carotid diameter as the denominator. CONTRAST:  99mL OMNIPAQUE IOHEXOL 350 MG/ML SOLN COMPARISON:  None. FINDINGS: CTA NECK FINDINGS SKELETON: There is no bony spinal canal stenosis. No lytic or blastic lesion. OTHER NECK: Normal pharynx, larynx and major salivary glands. No cervical lymphadenopathy. Unremarkable thyroid gland. UPPER CHEST: No pneumothorax or pleural effusion. No nodules or masses. AORTIC ARCH: There is no calcific atherosclerosis of the aortic arch. There is no aneurysm, dissection or hemodynamically significant stenosis of the visualized portion of the aorta. Conventional 3 vessel aortic branching pattern. The visualized proximal subclavian  arteries are widely patent. RIGHT CAROTID SYSTEM: No dissection, occlusion or aneurysm. Mild atherosclerotic calcification at the carotid bifurcation without hemodynamically significant stenosis. LEFT CAROTID SYSTEM: No dissection, occlusion or aneurysm. Mild atherosclerotic calcification at the carotid bifurcation without hemodynamically significant stenosis. VERTEBRAL ARTERIES: Codominant configuration. Both origins are clearly patent. There is no dissection, occlusion or flow-limiting stenosis to the skull base (V1-V3 segments). CTA HEAD FINDINGS POSTERIOR CIRCULATION: --Vertebral arteries: Normal V4 segments. --Inferior cerebellar arteries: Normal. --Basilar artery: Normal. --Superior cerebellar arteries: Normal. --Posterior cerebral arteries (PCA): Normal. ANTERIOR CIRCULATION: --Intracranial internal carotid arteries: Normal. --Anterior cerebral arteries (ACA): Normal. Both A1 segments are present. Patent  anterior communicating artery (a-comm). --Middle cerebral arteries (MCA): Normal. VENOUS SINUSES: As permitted by contrast timing, patent. ANATOMIC VARIANTS: None Review of the MIP images confirms the above findings. IMPRESSION: 1. No emergent large vessel occlusion or high-grade stenosis of the intracranial arteries. 2. Mild bilateral carotid bifurcation atherosclerosis without hemodynamically significant stenosis by NASCET criteria. Electronically Signed   By: Deatra Robinson M.D.   On: 07/11/2020 22:00   MR BRAIN WO CONTRAST  Result Date: 07/11/2020 CLINICAL DATA:  Sudden onset dizziness EXAM: MRI HEAD WITHOUT CONTRAST TECHNIQUE: Multiplanar, multiecho pulse sequences of the brain and surrounding structures were obtained without intravenous contrast. COMPARISON:  Brain MRI 03/04/2020 FINDINGS: Brain: Small acute infarct within the right parietal white matter, anterior to the site of the previously demonstrated infarct on 03/04/2020. No other site of acute ischemia. No acute hemorrhage. Single focus of chronic microhemorrhage in the left centrum semiovale. There is multifocal hyperintense T2-weighted signal within the white matter. Parenchymal volume and CSF spaces are normal. The midline structures are normal. Vascular: Major flow voids are preserved. Skull and upper cervical spine: Normal calvarium and skull base. Visualized upper cervical spine and soft tissues are normal. Sinuses/Orbits:Fluid in the left maxillary and sphenoid sinuses. Normal orbits. IMPRESSION: 1. Small acute infarct within the right parietal white matter , anterior to the site of the previous infarct of 02/2020. No acute hemorrhage or mass effect. 2. Findings of chronic small vessel disease. Electronically Signed   By: Deatra Robinson M.D.   On: 07/11/2020 19:46   EKG: Independently reviewed.  Sinus rhythm, 63 bpm, some T wave flattening aVL, similar to previous.  Assessment/Plan Active Problems:   Acute CVA (cerebrovascular accident)  (HCC)   COVID-19 virus infection  Acute stroke History of prior CVA > Symptoms of dizziness and blurry vision for the last day.  Small acute infarct noted on MRI > Neurology consulted in the ED and are following, appreciate recommendations -Neurology states okay to correct blood pressure to 120s to 140s systolic given less likely to have penumbra with stroke location - ASA 325 mg daily  - Plavix 75 Daily - Conitnue Statin  - Echocardiogram  - CTA head & neck  - A1C  - Lipid panel  - Tele monitoring  - SLP eval - PT/OT  Covid-19 infection > Incidental, no shortness of breath.  No fever.  Trace rales on left - Continue to monitor  DVT prophylaxis: Lovenox Code Status:   Full  Family Communication:  None on admission  Disposition Plan:   Patient is from:  Home  Anticipated DC to:  Home  Anticipated DC date:  1 to 2 days  Anticipated DC barriers: None  Consults called:  Neurology, Dr. Otelia Limes consulted in the ED  Admission status:  Observation, telemetry   Severity of Illness: The appropriate patient status for this patient is OBSERVATION. Observation status  is judged to be reasonable and necessary in order to provide the required intensity of service to ensure the patient's safety. The patient's presenting symptoms, physical exam findings, and initial radiographic and laboratory data in the context of their medical condition is felt to place them at decreased risk for further clinical deterioration. Furthermore, it is anticipated that the patient will be medically stable for discharge from the hospital within 2 midnights of admission. The following factors support the patient status of observation.   " The patient's presenting symptoms include dizziness, blurry vision. " The physical exam findings include stable findings, trace rales. " The initial radiographic and laboratory data are concerning for confirmed small infarct on MRI.  Synetta Fail MD [facility]  How to contact the [facility] Attending or Consulting provider 7A - 7P or covering provider during after hours 7P -7A, for this patient?   1. Check the care team in [facility] and look for a) attending/consulting [facility] provider listed and b) the [facility] team listed 2. Log into [email or web address] and use [place]'s universal password to access. If you do not have the password, please contact the hospital operator. 3. Locate the [facility] provider you are looking for under [facility] and page to a number that you can be directly reached. 4. If you still have difficulty reaching the provider, please page the [facility] (Director on Call) for the Hospitalists listed on amion for assistance.  07/11/2020, 10:16 PM

## 2020-07-11 NOTE — ED Notes (Signed)
[facility] at bedside  

## 2020-07-11 NOTE — ED Notes (Signed)
ED Provider at bedside. 

## 2020-07-11 NOTE — ED Provider Notes (Signed)
Blood pressure (!) 148/93, pulse 78, temperature 98.6 F (37 C), temperature source Oral, resp. rate 15, height 6\' 1"  (1.854 m), weight 102.1 kg, SpO2 97 %.  In short, Gregory Wong is a 60 y.o. male with a chief complaint of Headache .  Refer to the original H&P for additional details.  Patient arrives to the ED from [facility] for MRI. Patient is COVID positive. MRI shows a tiny area of acute infarct. Reviewed the MRI read with Dr[facility] who will consult on the patient but will need admit for CVA work up. Updated patient and wife at bedside.   Discussed patient's case with [facility] to request admission. Patient and family (if present) updated with plan. Care transferred to [facility] service.  I reviewed all nursing notes, vitals, pertinent old records, EKGs, labs, imaging (as available).  Gregory Wong was evaluated in Emergency Department for the symptoms described in the history of present illness. He was evaluated in the context of the global COVID-19 pandemic, which necessitated consideration that the patient might be at risk for infection with the SARS-CoV-2 virus that causes COVID-19. Institutional protocols and algorithms that pertain to the evaluation of patients at risk for COVID-19 are in a state of rapid change based on information released by regulatory bodies including the CDC and federal and state organizations. These policies and algorithms were followed during the patient's care in the ED.     Jamesetta Geralds, MD 07/12/20 [phone]

## 2020-07-11 NOTE — ED Provider Notes (Signed)
[facility] EMERGENCY DEPARTMENT Provider Note   CSN: [id number] Arrival date & time: 07/11/20  1104     History Chief Complaint  Patient presents with  . Headache    Gregory Wong is a 60 y.o. male.  Patient presents with his wife stating that last night he began to feel like the room was spinning after watching a movie.  Patient reports that he felt dizzy and unsteady on his feet at this time.  He also reports that the dizziness was accompanied by a right sided frontal to occipital headache.  He reports some blurry vision that is persistent now.  Patient reports that he does not take blood pressure medication at home.  He reports some nausea with the dizziness and reports that dizziness felt like vertigo.  He repeats that the room was spinning.  He reports having cold-like symptoms 1 week ago after his son was diagnosed with a sinus infection.  States that he has a history of stroke with the last one being in September 2021.  Patient states that he has any residual left-sided weakness.  Wife denies any facial droop, slurring of speech or vision ear since patient reported dizziness.  Patient states that he does not take any medication to help with the headache.  Patient was watching TV when symptoms started. He is nonsmoker and does not drink alcohol.         Past Medical History:  Diagnosis Date  . Gastric ulcer   . Stroke [facility])     There are no problems to display for this patient.   Past Surgical History:  Procedure Laterality Date  . ANKLE ARTHROSCOPY    . CHOLECYSTECTOMY         No family history on file.  Social History   Tobacco Use  . Smoking status: Never Smoker  . Smokeless tobacco: Never Used  Vaping Use  . Vaping Use: Never used  Substance Use Topics  . Alcohol use: No  . Drug use: No    Home Medications Prior to Admission medications   Medication Sig Start Date End Date Taking? Authorizing Provider  acetaminophen (TYLENOL) 500 MG tablet  Take 1 tablet (500 mg total) by mouth every 6 (six) hours as needed. 09/04/17   Law, Waylan Boga, PA-C  albuterol (PROVENTIL HFA;VENTOLIN HFA) 108 (90 BASE) MCG/ACT inhaler Inhale 1-2 puffs into the lungs every 6 (six) hours as needed for wheezing or shortness of breath. 02/25/14   Rancour, Jeannett Senior, MD  Diclofenac Sodium 3 % GEL Place 2 g onto the skin 2 (two) times daily. 04/06/18   Aviva Kluver B, PA-C  diphenhydrAMINE (BENADRYL) 25 MG tablet Take 1 tablet (25 mg total) by mouth every 6 (six) hours. 02/25/14   Rancour, Jeannett Senior, MD  fluticasone (FLONASE) 50 MCG/ACT nasal spray Place 1 spray into both nostrils daily. 04/06/18   Aviva Kluver B, PA-C  HYDROcodone-acetaminophen (NORCO) 5-325 MG per tablet Take 2 tablets by mouth every 4 (four) hours as needed for pain. 03/12/13   Geoffery Lyons, MD  hydrocortisone cream 1 % Apply to affected area 2 times daily 02/25/14   Rancour, Jeannett Senior, MD  ibuprofen (ADVIL,MOTRIN) 600 MG tablet Take 1 tablet (600 mg total) by mouth every 6 (six) hours as needed. 09/04/17   Law, Waylan Boga, PA-C  methocarbamol (ROBAXIN) 500 MG tablet Take 1 tablet (500 mg total) by mouth 2 (two) times daily. 09/04/17   Law, Waylan Boga, PA-C  naproxen (NAPROSYN) 375 MG tablet Take 1 tablet (375  mg total) by mouth 2 (two) times daily. 12/18/14   Palumbo, April, MD    Allergies    Metformin and related and Morphine and related  Review of Systems   Review of Systems  Constitutional: Negative for chills, diaphoresis and fever.  HENT: Positive for rhinorrhea. Negative for voice change.   Respiratory: Negative for cough and shortness of breath.   Gastrointestinal: Positive for nausea. Negative for anal bleeding and vomiting.  Musculoskeletal: Negative for neck stiffness.  Neurological: Positive for dizziness, numbness and headaches. Negative for facial asymmetry, speech difficulty and weakness.    Physical Exam Updated Vital Signs BP 129/83   Pulse 62   Temp 98.2 F (36.8 C) (Oral)    Resp 16   Ht 6\' 1"  (1.854 m)   Wt 102.1 kg   SpO2 97%   BMI 29.69 kg/m   Physical Exam Vitals reviewed.  HENT:     Mouth/Throat:     Mouth: Mucous membranes are moist.  Eyes:     General: No visual field deficit or scleral icterus.    Extraocular Movements: Extraocular movements intact.     Pupils: Pupils are equal, round, and reactive to light.  Cardiovascular:     Rate and Rhythm: Normal rate and regular rhythm.     Heart sounds: Normal heart sounds.  Pulmonary:     Effort: Pulmonary effort is normal.     Breath sounds: Normal breath sounds.  Abdominal:     Tenderness: There is no abdominal tenderness.  Skin:    Capillary Refill: Capillary refill takes less than 2 seconds.  Neurological:     Mental Status: He is alert and oriented to person, place, and time.     GCS: GCS eye subscore is 4. GCS verbal subscore is 5. GCS motor subscore is 6.     Cranial Nerves: No cranial nerve deficit, dysarthria or facial asymmetry.     Sensory: Sensory deficit present.     Motor: Motor function is intact. No weakness, atrophy or abnormal muscle tone.     Coordination: Finger-Nose-Finger Test and Heel to [place] Test normal.     Gait: Gait is intact.     Comments: Sensation decreased on left upper and lower extremities      ED Results / Procedures / Treatments   Labs (all labs ordered are listed, but only abnormal results are displayed) Labs Reviewed  COMPREHENSIVE METABOLIC PANEL - Abnormal; Notable for the following components:      Result Value   Glucose, Bld 112 (*)    All other components within normal limits  SARS CORONAVIRUS 2 BY RT PCR (HOSPITAL ORDER, PERFORMED IN [facility])  CBC WITH DIFFERENTIAL/PLATELET  D-DIMER, QUANTITATIVE (NOT AT [facility])  PROTIME-INR  TSH    EKG None  Radiology CT Head Wo Contrast  Result Date: 07/11/2020 CLINICAL DATA:  Dizziness EXAM: CT HEAD WITHOUT CONTRAST TECHNIQUE: Contiguous axial images were obtained from the base of  the skull through the vertex without intravenous contrast. COMPARISON:  March 04, 2020 FINDINGS: Brain: No evidence of acute infarction, hemorrhage, hydrocephalus, extra-axial collection or mass lesion/mass effect. Vascular: Vascular calcifications of the carotid siphons. Skull: Normal. Negative for fracture or focal lesion. Sinuses/Orbits: Mucosal thickening of the LEFT sphenoid sinus. Mild mucosal thickening throughout the ethmoid air cells and frontal sinuses. Other: None. IMPRESSION: No acute intracranial abnormality. Electronically Signed   By: March 06, 2020 MD   On: 07/11/2020 14:18    Procedures Procedures   Medications Ordered in ED  Medications  ondansetron (ZOFRAN) injection 4 mg (has no administration in time range)  meclizine (ANTIVERT) tablet 50 mg (has no administration in time range)    ED Course  I have reviewed the triage vital signs and the nursing notes.  Pertinent labs & imaging results that were available during my care of the patient were reviewed by me and considered in my medical decision making (see chart for details).    MDM Rules/Calculators/A&P                          Matias Thurman is a 60 y.o. male presenting with HA and dizziness for one day who reports hx of two strokes with last being in Sept of 2021. Patient's neurological exam appears to be grossly intact noting some residual effects in his left upper and lower extremities. Sensation also mildly impaired with more sensation on right side. BP elevated 148/105, otherwise afebrile, normal heart and respiratory rate with 98% oxygen saturations. Wife reports normal mental status throughout presentation symptoms. Given patient's history of multiple strokes, concern that persistent headache and reported neurological symptoms and lower extremity and dizziness may be sign of ischemia.  Also consider vertigo as patient reports symptoms of dizziness with position change and reports of "room spinning".  Will order  CT structural abnormalities.  We will also lab work to assess for any electrolyte abnormalities, signs of infection that would contribute to patient's symptoms.  Also given patient's report of recent contact with ill family member, will order COVID-19 testing.  Reassured by patient's 90% oxygen saturation and lack of respiratory complaints however given his history of stroke infection with Covid-19 would increase the risk for embolus development. Review of head CT shows no acute intracranial abnormalities.  Review of EKG does not show any signs of ischemia or abnormal rhythm.  CBC within normal limits. Negative D-dimer with improved BP. Case signed out to oncoming provider at end of shift, COVID pending.   Final Clinical Impression(s) / ED Diagnoses Final diagnoses:  None    Rx / DC Orders ED Discharge Orders    None       Ronnald Ramp, MD 07/11/20 1520    Alvira Monday, MD 07/11/20 1620

## 2020-07-11 NOTE — ED Notes (Signed)
Dr. Linzen at bedside. 

## 2020-07-11 NOTE — Consult Note (Signed)
Referring Physician: Dr. Jacqulyn Bath    Chief Complaint: MRI obtained for vertigo reveals a punctate subacute deep white matter ischemic infarction on the right  HPI: Gregory Wong is an 60 y.o. male with a prior history of stroke and pacemaker placement who presented this AM to [facility] with headache and vertigo that started last night. He also complained of intermittent cough and runny nose "for a while". He tested positive for Covid despite having been vaccinated x 2.   He was transferred to the [facility] ED, where MRI brain to further assess for the underlying etiology of the vertigo revealed a small acute infarct within the right parietal white matter, anterior to the site of a previous infarct of 02/2020.   LSN: Yesterday night tPA Given: No: Out of the time window  Past Medical History:  Diagnosis Date  . Gastric ulcer   . Stroke [facility])     Past Surgical History:  Procedure Laterality Date  . ANKLE ARTHROSCOPY    . CHOLECYSTECTOMY      No family history on file. Social History:  reports that he has never smoked. He has never used smokeless tobacco. He reports that he does not drink alcohol and does not use drugs.  Allergies:  Allergies  Allergen Reactions  . Baclofen     Other reaction(s): Dizziness (intolerance)  . Metformin And Related   . Morphine And Related     Medications:  Prior to Admission: (Not in a hospital admission)  Scheduled: .  stroke: mapping our early stages of recovery book   Does not apply Once  . enoxaparin (LOVENOX) injection  40 mg Subcutaneous Q24H    ROS: As per HPI. Denies additional complaints.   Physical Examination: Blood pressure (!) 148/93, pulse 78, temperature 98.6 F (37 C), temperature source Oral, resp. rate 15, height 6\' 1"  (1.854 m), weight 102.1 kg, SpO2 97 %.  HEENT: Ward/AT Lungs: Respirations unlabored Ext: No edema  Neurologic Examination: Mental Status: Awake and alert. Oriented x 5. Speech fluent with intact comprehension and  naming.  Cranial Nerves: II:  Visual fields intact with no extinction to DSS. PERRL.  III,IV, VI: No ptosis. EOMI. No nystagmus.  V,VII: Smile symmetric, facial temp sensation equal bilaterally VIII: hearing intact to voice IX,X: No hypophonia XI: Symmetric XII: midline tongue extension  Motor: Right : Upper extremity   5/5    Left:     Upper extremity   5/5  Lower extremity   5/5     Lower extremity   5/5 No pronator drift Sensory: Temp and FT intact in BUE and RLE. Has decreased FT and temp sensation to entire LLE below the hip.  Deep Tendon Reflexes:  1+ bilateral brachioradialis.  Unelicitable patellar and achilles reflexes Cerebellar: No ataxia with FNF bilaterally  Gait: Deferred  Results for orders placed or performed during the hospital encounter of 07/11/20 (from the past 48 hour(s))  Comprehensive metabolic panel     Status: Abnormal   Collection Time: 07/11/20  2:13 PM  Result Value Ref Range   Sodium 138 135 - 145 mmol/L   Potassium 4.0 3.5 - 5.1 mmol/L   Chloride 101 98 - 111 mmol/L   CO2 29 22 - 32 mmol/L   Glucose, Bld 112 (H) 70 - 99 mg/dL    Comment: Glucose reference range applies only to samples taken after fasting for at least 8 hours.   BUN 11 6 - 20 mg/dL   Creatinine, Ser 3.75 0.61 - 1.24 mg/dL  Calcium 9.4 8.9 - 10.3 mg/dL   Total Protein 7.4 6.5 - 8.1 g/dL   Albumin 3.9 3.5 - 5.0 g/dL   AST 27 15 - 41 U/L   ALT 35 0 - 44 U/L   Alkaline Phosphatase 65 38 - 126 U/L   Total Bilirubin 0.8 0.3 - 1.2 mg/dL   GFR, Estimated >64 >33 mL/min    Comment: (NOTE) Calculated using the CKD-EPI Creatinine Equation (2021)    Anion gap 8 5 - 15    Comment: Performed at [facility], [address] [place], [place] [address]  CBC with Differential     Status: None   Collection Time: 07/11/20  2:13 PM  Result Value Ref Range   WBC 8.6 4.0 - 10.5 K/uL   RBC 4.87 4.22 - 5.81 MIL/uL   Hemoglobin 15.3 13.0 - 17.0 g/dL   HCT 84.1 66.0 - 63.0 %   MCV  91.4 80.0 - 100.0 fL   MCH 31.4 26.0 - 34.0 pg   MCHC 34.4 30.0 - 36.0 g/dL   RDW 16.0 10.9 - 32.3 %   Platelets 320 150 - 400 K/uL   nRBC 0.0 0.0 - 0.2 %   Neutrophils Relative % 52 %   Neutro Abs 4.6 1.7 - 7.7 K/uL   Lymphocytes Relative 33 %   Lymphs Abs 2.8 0.7 - 4.0 K/uL   Monocytes Relative 10 %   Monocytes Absolute 0.8 0.1 - 1.0 K/uL   Eosinophils Relative 4 %   Eosinophils Absolute 0.3 0.0 - 0.5 K/uL   Basophils Relative 1 %   Basophils Absolute 0.1 0.0 - 0.1 K/uL   Immature Granulocytes 0 %   Abs Immature Granulocytes 0.03 0.00 - 0.07 K/uL    Comment: Performed at [facility], [address] [place], [place] [address]  D-dimer, quantitative     Status: None   Collection Time: 07/11/20  2:13 PM  Result Value Ref Range   D-Dimer, Quant 0.29 0.00 - 0.50 ug/mL-FEU    Comment: (NOTE) At the manufacturer cut-off value of 0.5 g/mL FEU, this assay has a negative predictive value of 95-100%.This assay is intended for use in conjunction with a clinical pretest probability (PTP) assessment model to exclude pulmonary embolism (PE) and deep venous thrombosis (DVT) in outpatients suspected of PE or DVT. Results should be correlated with clinical presentation. Performed at [facility], [address] [place], [place] [address]   TSH     Status: None   Collection Time: 07/11/20  2:13 PM  Result Value Ref Range   TSH 0.621 0.350 - 4.500 uIU/mL    Comment: Performed by a 3rd Generation assay with a functional sensitivity of <=0.01 uIU/mL. Performed at [facility], [address], [place], [place] [address]   Protime-INR     Status: None   Collection Time: 07/11/20  2:13 PM  Result Value Ref Range   Prothrombin Time 13.0 11.4 - 15.2 seconds   INR 1.0 0.8 - 1.2    Comment: (NOTE) INR goal varies based on device and disease states. Performed at [facility], [address] [place], [place] [address]   SARS Coronavirus 2 by RT PCR  (hospital order, performed in [facility] hospital lab) Nasopharyngeal Nasopharyngeal Swab     Status: Abnormal   Collection Time: 07/11/20  2:14 PM   Specimen: Nasopharyngeal Swab  Result Value Ref Range   SARS Coronavirus 2 POSITIVE (  A) NEGATIVE    Comment: KARA RENNICKS RN @1558  07/11/2020 OLSONM (NOTE) SARS-CoV-2 target nucleic acids are DETECTED  SARS-CoV-2 RNA is generally detectable in upper respiratory specimens  during the acute phase of infection.  Positive results are indicative  of the presence of the identified virus, but do not rule out bacterial infection or co-infection with other pathogens not detected by the test.  Clinical correlation with patient history and  other diagnostic information is necessary to determine patient infection status.  The expected result is negative.  Fact Sheet for Patients:   07/13/2020   Fact Sheet for Healthcare Providers:   BoilerBrush.com.cy    This test is not yet approved or cleared by the https://pope.com/ FDA and  has been authorized for detection and/or diagnosis of SARS-CoV-2 by FDA under an Emergency Use Authorization (EUA).  This EUA will remain in effect (meaning this test can be used) for the duration of  the COV ID-19 declaration under Section 564(b)(1) of the Act, 21 U.S.C. section 360-bbb-3(b)(1), unless the authorization is terminated or revoked sooner.  Performed at [facility], [address] [place], [place]    CT Head Wo Contrast  Result Date: 07/11/2020 CLINICAL DATA:  Dizziness EXAM: CT HEAD WITHOUT CONTRAST TECHNIQUE: Contiguous axial images were obtained from the base of the skull through the vertex without intravenous contrast. COMPARISON:  March 04, 2020 FINDINGS: Brain: No evidence of acute infarction, hemorrhage, hydrocephalus, extra-axial collection or mass lesion/mass effect. Vascular: Vascular calcifications of the carotid  siphons. Skull: Normal. Negative for fracture or focal lesion. Sinuses/Orbits: Mucosal thickening of the LEFT sphenoid sinus. Mild mucosal thickening throughout the ethmoid air cells and frontal sinuses. Other: None. IMPRESSION: No acute intracranial abnormality. Electronically Signed   By: March 06, 2020 MD   On: 07/11/2020 14:18   MR BRAIN WO CONTRAST  Result Date: 07/11/2020 CLINICAL DATA:  Sudden onset dizziness EXAM: MRI HEAD WITHOUT CONTRAST TECHNIQUE: Multiplanar, multiecho pulse sequences of the brain and surrounding structures were obtained without intravenous contrast. COMPARISON:  Brain MRI 03/04/2020 FINDINGS: Brain: Small acute infarct within the right parietal white matter, anterior to the site of the previously demonstrated infarct on 03/04/2020. No other site of acute ischemia. No acute hemorrhage. Single focus of chronic microhemorrhage in the left centrum semiovale. There is multifocal hyperintense T2-weighted signal within the white matter. Parenchymal volume and CSF spaces are normal. The midline structures are normal. Vascular: Major flow voids are preserved. Skull and upper cervical spine: Normal calvarium and skull base. Visualized upper cervical spine and soft tissues are normal. Sinuses/Orbits:Fluid in the left maxillary and sphenoid sinuses. Normal orbits. IMPRESSION: 1. Small acute infarct within the right parietal white matter , anterior to the site of the previous infarct of 02/2020. No acute hemorrhage or mass effect. 2. Findings of chronic small vessel disease. Electronically Signed   By: 03/2020 M.D.   On: 07/11/2020 19:46    Assessment: 60 y.o. male with a prior history of stroke and pacemaker placement who presented this AM to [facility] with headache and vertigo that started last night. He also complained of intermittent cough and runny nose "for a while". He tested positive for Covid despite having been vaccinated x 2. He was transferred to the [facility] ED, where MRI brain  to further assess for the underlying etiology of the vertigo revealed a small acute infarct within the right parietal white matter, anterior to the site of a previous infarct of 02/2020.  1. Exam reveals  LLE sensory numbness. No focal motor deficit appreciated. NIHSS = 1.  2. MRI brain: Small acute infarct within the right parietal white matter, anterior to the site of the previous infarct of 02/2020. No acute hemorrhage or mass effect. Findings of chronic small vessel disease. 3. Stroke Risk Factors - Covid infection and prior stroke 4. Classifiable as having failed ASA monotherapy  Recommendations: 1. HgbA1c, fasting lipid panel 2. CTA of head and neck 3. PT consult, OT consult, Speech consult 4. TTE 5. Cardiac telemetry 6. Prophylactic therapy- Add Plavix to ASA.  7. Risk factor modification 8. Telemetry monitoring 9. Frequent neuro checks 10. Statin 11. BP management with SBP goal of 120-140. The punctate stroke is unlikely to have a significant underperfused penumbra.    @Electronically  signed: Dr.  07/11/2020, 8:46 PM

## 2020-07-11 NOTE — ED Triage Notes (Signed)
Pt c/o HA, dizziness started last night-reports intermittent cough, runny nose "for a while"-NAD-steady gait

## 2020-07-12 ENCOUNTER — Other Ambulatory Visit (HOSPITAL_COMMUNITY): Payer: Self-pay | Admitting: Internal Medicine

## 2020-07-12 ENCOUNTER — Observation Stay (HOSPITAL_BASED_OUTPATIENT_CLINIC_OR_DEPARTMENT_OTHER): Payer: BC Managed Care – PPO

## 2020-07-12 DIAGNOSIS — U071 COVID-19: Secondary | ICD-10-CM | POA: Diagnosis not present

## 2020-07-12 DIAGNOSIS — I639 Cerebral infarction, unspecified: Secondary | ICD-10-CM | POA: Diagnosis not present

## 2020-07-12 DIAGNOSIS — I6389 Other cerebral infarction: Secondary | ICD-10-CM | POA: Diagnosis not present

## 2020-07-12 LAB — RAPID URINE DRUG SCREEN, HOSP PERFORMED
Amphetamines: NOT DETECTED
Barbiturates: NOT DETECTED
Benzodiazepines: NOT DETECTED
Cocaine: NOT DETECTED
Opiates: NOT DETECTED
Tetrahydrocannabinol: NOT DETECTED

## 2020-07-12 LAB — ECHOCARDIOGRAM COMPLETE
Area-P 1/2: 0.96 cm2
Height: 73 in
S' Lateral: 3 cm
Weight: 3600 oz

## 2020-07-12 LAB — LIPID PANEL
Cholesterol: 117 mg/dL (ref 0–200)
HDL: 40 mg/dL — ABNORMAL LOW (ref >40)
LDL Cholesterol: 63 mg/dL (ref 0–99)
Total CHOL/HDL Ratio: 2.9 RATIO
Triglycerides: 72 mg/dL (ref <150)
VLDL: 14 mg/dL (ref 0–40)

## 2020-07-12 LAB — HEMOGLOBIN A1C
Hgb A1c MFr Bld: 6.8 % — ABNORMAL HIGH (ref 4.8–5.6)
Mean Plasma Glucose: 148.46 mg/dL

## 2020-07-12 MED ORDER — CLOPIDOGREL BISULFATE 75 MG PO TABS: 75.0000 mg | ORAL_TABLET | Freq: Every day | ORAL | 1 refills | Status: DC

## 2020-07-12 MED FILL — CLOPIDOGREL 75 MG TABLET: 75 | 30 days supply | Qty: 30 | Fill #0

## 2020-07-12 NOTE — Evaluation (Signed)
Physical Therapy Evaluation and Discharge Patient Details Name: Gregory Wong MRN: [id number] DOB: 02/25/1961 Today's Date: 07/12/2020   History of Present Illness  Pt is a 60 y/o male admitted secondary to blurry vision and dizziness. 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 PMH includes CVA.  Clinical Impression  Patient evaluated by Physical Therapy with no further acute PT needs identified. All education has been completed and the patient has no further questions. Pt overall steady with gait during room. Overall at a supervision level. Pt with mild dizziness, but did not affect mobility. Pt with concerns about return to work as he has to do heavy lifting. Educated about outpatient PT to determine readiness for return to work, but pt would like to discuss with his primary first. See below for any follow-up Physical Therapy or equipment needs. PT is signing off. Thank you for this referral. If needs change, please re-consult.      Follow Up Recommendations Other (comment) (pt wants to discuss outpatient PT with his primary MD)    Equipment Recommendations  None recommended by PT    Recommendations for Other Services       Precautions / Restrictions Precautions Precautions: Fall Restrictions Weight Bearing Restrictions: No      Mobility  Bed Mobility Overal bed mobility: Independent                  Transfers Overall transfer level: Needs assistance Equipment used: None Transfers: Sit to/from Stand Sit to Stand: Supervision         General transfer comment: supervision for safety  Ambulation/Gait Ambulation/Gait assistance: Supervision Gait Distance (Feet): 30 Feet Assistive device: None Gait Pattern/deviations: Step-through pattern;Decreased stride length Gait velocity: Decreased   General Gait Details: Overall steady gait, no LOB noted. Pt reports mild dizziness, but BP WFL  Stairs            Wheelchair Mobility    Modified Rankin (Stroke  Patients Only) Modified Rankin (Stroke Patients Only) Pre-Morbid Rankin Score: No symptoms Modified Rankin: Moderately severe disability     Balance Overall balance assessment: No apparent balance deficits (not formally assessed)                                           Pertinent Vitals/Pain Pain Assessment: No/denies pain    Home Living Family/patient expects to be discharged to:: Private residence Living Arrangements: Spouse/significant other Available Help at Discharge: Family Type of Home: House Home Access: Level entry     Home Layout: One level Home Equipment: None      Prior Function Level of Independence: Independent               Hand Dominance        Extremity/Trunk Assessment   Upper Extremity Assessment Upper Extremity Assessment: Defer to OT evaluation    Lower Extremity Assessment Lower Extremity Assessment: LLE deficits/detail LLE Deficits / Details: MMT 5/5, but reports functional weakness    Cervical / Trunk Assessment Cervical / Trunk Assessment: Normal  Communication   Communication: No difficulties  Cognition Arousal/Alertness: Awake/alert Behavior During Therapy: WFL for tasks assessed/performed Overall Cognitive Status: Within Functional Limits for tasks assessed  General Comments General comments (skin integrity, edema, etc.): Pt concerned about return to work. Discussed outpatient PT to determine ability for return to work, however, pt requesting to wait until he speaks with his MD.    Exercises     Assessment/Plan    PT Assessment Patent does not need any further PT services  PT Problem List         PT Treatment Interventions      PT Goals (Current goals can be found in the Care Plan section)  Acute Rehab PT Goals Patient Stated Goal: to go home PT Goal Formulation: With patient Time For Goal Achievement: 07/12/20 Potential to Achieve Goals:  Good    Frequency     Barriers to discharge        Co-evaluation               AM-PAC PT "6 Clicks" Mobility  Outcome Measure Help needed turning from your back to your side while in a flat bed without using bedrails?: None Help needed moving from lying on your back to sitting on the side of a flat bed without using bedrails?: None Help needed moving to and from a bed to a chair (including a wheelchair)?: None Help needed standing up from a chair using your arms (e.g., wheelchair or bedside chair)?: None Help needed to walk in hospital room?: None Help needed climbing 3-5 steps with a railing? : A Little 6 Click Score: 23    End of Session Equipment Utilized During Treatment: Gait belt Activity Tolerance: Patient tolerated treatment well Patient left: in bed;with call bell/phone within reach (on stretcher in ED) Nurse Communication: Mobility status PT Visit Diagnosis: Other symptoms and signs involving the nervous system (O84.166)    Time: 0630-1601 PT Time Calculation (min) (ACUTE ONLY): 19 min   Charges:   PT Evaluation $PT Eval Low Complexity: 1 Low          Cindee Salt, DPT  Acute Rehabilitation Services  Pager: [phone] Office: [phone]   Lehman Prom 07/12/2020, 2:59 PM

## 2020-07-12 NOTE — Progress Notes (Signed)
Pt seen and evaluated. Reports some functional L sided weakness (did not note on MMT) and mild lightheadedness, when ambulating, however, did not seem to limit mobility. Pt overall at a supervision level within ED room. Pt reports his job involves heavy lifting, and discussed going to outpatient PT to assess for return to work readiness. Pt reports he would like to speak with his MD first prior to going to outpatient. Also discussed needing note from physician to return to work. Formal note to follow.   Farley Ly, PT, DPT  Acute Rehabilitation Services  Pager: [phone] Office: [phone]

## 2020-07-12 NOTE — ED Notes (Signed)
Pt's HOB lowered & lights dimmed for comfort

## 2020-07-12 NOTE — Discharge Summary (Signed)
Physician Discharge Summary  Harol Masoner XVQ:[id number] DOB: 24-Sep-1960 DOA: 07/11/2020  PCP: Jolene Provost, MD  Admit date: 07/11/2020 Discharge date: 07/12/2020  Admitted From: Home Disposition: Home  Recommendations for Outpatient Follow-up:  1. Follow up with PCP in 1-2 weeks 2. Please obtain BMP/CBC in one week 3. Please follow up on the following pending results:  Home Health: None Equipment/Devices: None  Discharge Condition: Stable CODE STATUS: Full Diet recommendation:    Brief/Interim Summary: Pearletha Alfred a 60 y.o.malewith medical history significant ofCVA, gastric ulcer who presented with 1 day of blurry vision and some recent dizziness.Patient reports that last night he had a room spinning sensation after watching a movie. He was dizzy and unsteady on his feet at thattime. He also had associated headache and sweating. He states he also had some blurry vision which persisted for a couple of hours.He denies fevers, chills, cough, shortness of breath, constipation, nausea, weakness.  Patient admitted as above with acute onset dizziness and blurry vision.  MRI did show a small acute infarct of the right parietal lobe without hemorrhage.  Neurology following.  Given resolution of patient symptoms, minimal findings on imaging neurology did recommend dual antiplatelet therapy as well as additional statin with close follow-up with outpatient neurologist Lahoma Rocker [place].  Patient will follow-up for possible outpatient PT as indicated after further evaluation with primary neurologist and PCP.  Patient otherwise stable and agreeable for discharge home.  Discharge Diagnoses:  Active Problems:   Acute CVA (cerebrovascular accident) (HCC)   COVID-19 virus infection    Discharge Instructions  Discharge Instructions    Diet - low sodium heart healthy   Complete by: As directed    Increase activity slowly   Complete by: As directed      Allergies as of 07/12/2020       Reactions   Baclofen    Other reaction(s): Dizziness (intolerance)   Metformin And Related    Unknown   Morphine And Related    Unknown      Medication List    STOP taking these medications   acetaminophen 500 MG tablet Commonly known as: TYLENOL   Diclofenac Sodium 3 % Gel   diphenhydrAMINE 25 MG tablet Commonly known as: BENADRYL   fluticasone 50 MCG/ACT nasal spray Commonly known as: FLONASE   HYDROcodone-acetaminophen 5-325 MG tablet Commonly known as: Norco   hydrocortisone cream 1 %   ibuprofen 600 MG tablet Commonly known as: ADVIL     TAKE these medications   albuterol 108 (90 Base) MCG/ACT inhaler Commonly known as: VENTOLIN HFA Inhale 1-2 puffs into the lungs every 6 (six) hours as needed for wheezing or shortness of breath.   aspirin 325 MG tablet Take 325 mg by mouth daily.   atorvastatin 80 MG tablet Commonly known as: LIPITOR Take 80 mg by mouth daily.   clopidogrel 75 MG tablet Commonly known as: PLAVIX Take 1 tablet (75 mg total) by mouth daily. Start taking on: July 13, 2020       Allergies  Allergen Reactions  . Baclofen     Other reaction(s): Dizziness (intolerance)  . Metformin And Related     Unknown  . Morphine And Related     Unknown    Consultations:  Neurology   Procedures/Studies: CT ANGIO HEAD W OR WO CONTRAST  Result Date: 07/11/2020 CLINICAL DATA:  Dizziness, sudden onset EXAM: CT ANGIOGRAPHY HEAD AND NECK TECHNIQUE: Multidetector CT imaging of the head and neck was performed using the standard protocol  during bolus administration of intravenous contrast. Multiplanar CT image reconstructions and MIPs were obtained to evaluate the vascular anatomy. Carotid stenosis measurements (when applicable) are obtained utilizing NASCET criteria, using the distal internal carotid diameter as the denominator. CONTRAST:  75mL OMNIPAQUE IOHEXOL 350 MG/ML SOLN COMPARISON:  None. FINDINGS: CTA NECK FINDINGS SKELETON: There is no  bony spinal canal stenosis. No lytic or blastic lesion. OTHER NECK: Normal pharynx, larynx and major salivary glands. No cervical lymphadenopathy. Unremarkable thyroid gland. UPPER CHEST: No pneumothorax or pleural effusion. No nodules or masses. AORTIC ARCH: There is no calcific atherosclerosis of the aortic arch. There is no aneurysm, dissection or hemodynamically significant stenosis of the visualized portion of the aorta. Conventional 3 vessel aortic branching pattern. The visualized proximal subclavian arteries are widely patent. RIGHT CAROTID SYSTEM: No dissection, occlusion or aneurysm. Mild atherosclerotic calcification at the carotid bifurcation without hemodynamically significant stenosis. LEFT CAROTID SYSTEM: No dissection, occlusion or aneurysm. Mild atherosclerotic calcification at the carotid bifurcation without hemodynamically significant stenosis. VERTEBRAL ARTERIES: Codominant configuration. Both origins are clearly patent. There is no dissection, occlusion or flow-limiting stenosis to the skull base (V1-V3 segments). CTA HEAD FINDINGS POSTERIOR CIRCULATION: --Vertebral arteries: Normal V4 segments. --Inferior cerebellar arteries: Normal. --Basilar artery: Normal. --Superior cerebellar arteries: Normal. --Posterior cerebral arteries (PCA): Normal. ANTERIOR CIRCULATION: --Intracranial internal carotid arteries: Normal. --Anterior cerebral arteries (ACA): Normal. Both A1 segments are present. Patent anterior communicating artery (a-comm). --Middle cerebral arteries (MCA): Normal. VENOUS SINUSES: As permitted by contrast timing, patent. ANATOMIC VARIANTS: None Review of the MIP images confirms the above findings. IMPRESSION: 1. No emergent large vessel occlusion or high-grade stenosis of the intracranial arteries. 2. Mild bilateral carotid bifurcation atherosclerosis without hemodynamically significant stenosis by NASCET criteria. Electronically Signed   By: Deatra RobinsonKevin  Herman M.D.   On: 07/11/2020 22:00    CT Head Wo Contrast  Result Date: 07/11/2020 CLINICAL DATA:  Dizziness EXAM: CT HEAD WITHOUT CONTRAST TECHNIQUE: Contiguous axial images were obtained from the base of the skull through the vertex without intravenous contrast. COMPARISON:  March 04, 2020 FINDINGS: Brain: No evidence of acute infarction, hemorrhage, hydrocephalus, extra-axial collection or mass lesion/mass effect. Vascular: Vascular calcifications of the carotid siphons. Skull: Normal. Negative for fracture or focal lesion. Sinuses/Orbits: Mucosal thickening of the LEFT sphenoid sinus. Mild mucosal thickening throughout the ethmoid air cells and frontal sinuses. Other: None. IMPRESSION: No acute intracranial abnormality. Electronically Signed   By: Meda KlinefelterStephanie  Peacock MD   On: 07/11/2020 14:18   CT ANGIO NECK W OR WO CONTRAST  Result Date: 07/11/2020 CLINICAL DATA:  Dizziness, sudden onset EXAM: CT ANGIOGRAPHY HEAD AND NECK TECHNIQUE: Multidetector CT imaging of the head and neck was performed using the standard protocol during bolus administration of intravenous contrast. Multiplanar CT image reconstructions and MIPs were obtained to evaluate the vascular anatomy. Carotid stenosis measurements (when applicable) are obtained utilizing NASCET criteria, using the distal internal carotid diameter as the denominator. CONTRAST:  75mL OMNIPAQUE IOHEXOL 350 MG/ML SOLN COMPARISON:  None. FINDINGS: CTA NECK FINDINGS SKELETON: There is no bony spinal canal stenosis. No lytic or blastic lesion. OTHER NECK: Normal pharynx, larynx and major salivary glands. No cervical lymphadenopathy. Unremarkable thyroid gland. UPPER CHEST: No pneumothorax or pleural effusion. No nodules or masses. AORTIC ARCH: There is no calcific atherosclerosis of the aortic arch. There is no aneurysm, dissection or hemodynamically significant stenosis of the visualized portion of the aorta. Conventional 3 vessel aortic branching pattern. The visualized proximal subclavian  arteries are widely patent. RIGHT CAROTID SYSTEM: No  dissection, occlusion or aneurysm. Mild atherosclerotic calcification at the carotid bifurcation without hemodynamically significant stenosis. LEFT CAROTID SYSTEM: No dissection, occlusion or aneurysm. Mild atherosclerotic calcification at the carotid bifurcation without hemodynamically significant stenosis. VERTEBRAL ARTERIES: Codominant configuration. Both origins are clearly patent. There is no dissection, occlusion or flow-limiting stenosis to the skull base (V1-V3 segments). CTA HEAD FINDINGS POSTERIOR CIRCULATION: --Vertebral arteries: Normal V4 segments. --Inferior cerebellar arteries: Normal. --Basilar artery: Normal. --Superior cerebellar arteries: Normal. --Posterior cerebral arteries (PCA): Normal. ANTERIOR CIRCULATION: --Intracranial internal carotid arteries: Normal. --Anterior cerebral arteries (ACA): Normal. Both A1 segments are present. Patent anterior communicating artery (a-comm). --Middle cerebral arteries (MCA): Normal. VENOUS SINUSES: As permitted by contrast timing, patent. ANATOMIC VARIANTS: None Review of the MIP images confirms the above findings. IMPRESSION: 1. No emergent large vessel occlusion or high-grade stenosis of the intracranial arteries. 2. Mild bilateral carotid bifurcation atherosclerosis without hemodynamically significant stenosis by NASCET criteria. Electronically Signed   By: Deatra Robinson M.D.   On: 07/11/2020 22:00   MR BRAIN WO CONTRAST  Result Date: 07/11/2020 CLINICAL DATA:  Sudden onset dizziness EXAM: MRI HEAD WITHOUT CONTRAST TECHNIQUE: Multiplanar, multiecho pulse sequences of the brain and surrounding structures were obtained without intravenous contrast. COMPARISON:  Brain MRI 03/04/2020 FINDINGS: Brain: Small acute infarct within the right parietal white matter, anterior to the site of the previously demonstrated infarct on 03/04/2020. No other site of acute ischemia. No acute hemorrhage. Single focus of  chronic microhemorrhage in the left centrum semiovale. There is multifocal hyperintense T2-weighted signal within the white matter. Parenchymal volume and CSF spaces are normal. The midline structures are normal. Vascular: Major flow voids are preserved. Skull and upper cervical spine: Normal calvarium and skull base. Visualized upper cervical spine and soft tissues are normal. Sinuses/Orbits:Fluid in the left maxillary and sphenoid sinuses. Normal orbits. IMPRESSION: 1. Small acute infarct within the right parietal white matter , anterior to the site of the previous infarct of 02/2020. No acute hemorrhage or mass effect. 2. Findings of chronic small vessel disease. Electronically Signed   By: Deatra Robinson M.D.   On: 07/11/2020 19:46   ECHOCARDIOGRAM COMPLETE  Result Date: 07/12/2020    ECHOCARDIOGRAM REPORT   Patient Name:   LARS JEZIORSKI Date of Exam: 07/12/2020 Medical Rec #:  [id number]     Height:       73.0 in Accession #:    [id number]    Weight:       225.0 lb Date of Birth:  [birth year]     BSA:          2.262 m Patient Age:    59 years      BP:           120/83 mmHg Patient Gender: M             HR:           61 bpm. Exam Location:  Inpatient Procedure: 2D Echo, Cardiac Doppler and Color Doppler Indications:   Stroke  History:       Patient has no prior history of Echocardiogram examinations.                Stroke.  Sonographer:   Eulah Pont RDCS Referring      [id number] Cecille Po MELVIN Phys: IMPRESSIONS  1. Left ventricular ejection fraction, by estimation, is 55 to 60%. The left ventricle has normal function. The left ventricle has no regional wall motion abnormalities. Left ventricular diastolic parameters are consistent with Grade I diastolic  dysfunction (impaired relaxation).  2. Right ventricular systolic function is normal. The right ventricular size is normal.  3. The mitral valve is grossly normal. Trivial mitral valve regurgitation.  4. The aortic valve is tricuspid. Aortic valve  regurgitation is not visualized. Comparison(s): No prior Echocardiogram. FINDINGS  Left Ventricle: Left ventricular ejection fraction, by estimation, is 55 to 60%. The left ventricle has normal function. The left ventricle has no regional wall motion abnormalities. The left ventricular internal cavity size was normal in size. There is  no left ventricular hypertrophy. Left ventricular diastolic parameters are consistent with Grade I diastolic dysfunction (impaired relaxation). Indeterminate filling pressures. Right Ventricle: The right ventricular size is normal. No increase in right ventricular wall thickness. Right ventricular systolic function is normal. Left Atrium: Left atrial size was normal in size. Right Atrium: Right atrial size was normal in size. Pericardium: There is no evidence of pericardial effusion. Mitral Valve: The mitral valve is grossly normal. Trivial mitral valve regurgitation. Tricuspid Valve: The tricuspid valve is grossly normal. Tricuspid valve regurgitation is not demonstrated. Aortic Valve: The aortic valve is tricuspid. Aortic valve regurgitation is not visualized. Pulmonic Valve: The pulmonic valve was grossly normal. Pulmonic valve regurgitation is not visualized. Aorta: The aortic root and ascending aorta are structurally normal, with no evidence of dilitation. IAS/Shunts: No atrial level shunt detected by color flow Doppler.  LEFT VENTRICLE PLAX 2D LVIDd:         4.30 cm  Diastology LVIDs:         3.00 cm  LV e' medial:    5.77 cm/s LV PW:         0.70 cm  LV E/e' medial:  5.7 LV IVS:        0.90 cm  LV e' lateral:   9.03 cm/s LVOT diam:     2.10 cm  LV E/e' lateral: 3.6 LV SV:         53 LV SV Index:   24 LVOT Area:     3.46 cm  RIGHT VENTRICLE RV S prime:     9.79 cm/s TAPSE (M-mode): 1.9 cm LEFT ATRIUM             Index       RIGHT ATRIUM           Index LA diam:        3.30 cm 1.46 cm/m  RA Area:     16.10 cm LA Vol (A2C):   31.4 ml 13.88 ml/m RA Volume:   38.20 ml  16.89  ml/m LA Vol (A4C):   26.9 ml 11.89 ml/m LA Biplane Vol: 29.6 ml 13.09 ml/m  AORTIC VALVE LVOT Vmax:   75.10 cm/s LVOT Vmean:  51.700 cm/s LVOT VTI:    0.154 m MITRAL VALVE MV Area (PHT): 0.96 cm    SHUNTS MV Decel Time: 789 msec    Systemic VTI:  0.15 m MV E velocity: 32.70 cm/s  Systemic Diam: 2.10 cm MV A velocity: 51.40 cm/s MV E/A ratio:  0.64 Zoila Shutter MD Electronically signed by Zoila Shutter MD Signature Date/Time: 07/12/2020/11:46:25 AM    Final      Subjective: No acute issues or events overnight, symptoms drastically improving back to baseline, requesting discharge home which is certainly reasonable   Discharge Exam: Vitals:   07/12/20 0815 07/12/20 1045  BP: 126/83 118/80  Pulse: 62 75  Resp: 13 19  Temp:    SpO2: 95% 96%   Vitals:   07/12/20 0600 07/12/20  0630 07/12/20 0815 07/12/20 1045  BP: 124/81 120/83 126/83 118/80  Pulse: 65 65 62 75  Resp: 13 15 13 19   Temp:      TempSrc:      SpO2: 94% 95% 95% 96%  Weight:      Height:        General: Pt is alert, awake, not in acute distress Cardiovascular: RRR, S1/S2 +, no rubs, no gallops Respiratory: CTA bilaterally, no wheezing, no rhonchi Abdominal: Soft, NT, ND, bowel sounds + Extremities: no edema, no cyanosis    The results of significant diagnostics from this hospitalization (including imaging, microbiology, ancillary and laboratory) are listed below for reference.     Microbiology: Recent Results (from the past 240 hour(s))  SARS Coronavirus 2 by RT PCR (hospital order, performed in [facility] hospital lab) Nasopharyngeal Nasopharyngeal Swab     Status: Abnormal   Collection Time: 07/11/20  2:14 PM   Specimen: Nasopharyngeal Swab  Result Value Ref Range Status   SARS Coronavirus 2 POSITIVE (A) NEGATIVE Final    Comment: KARA RENNICKS RN @1558  07/11/2020 OLSONM (NOTE) SARS-CoV-2 target nucleic acids are DETECTED  SARS-CoV-2 RNA is generally detectable in upper respiratory specimens  during the  acute phase of infection.  Positive results are indicative  of the presence of the identified virus, but do not rule out bacterial infection or co-infection with other pathogens not detected by the test.  Clinical correlation with patient history and  other diagnostic information is necessary to determine patient infection status.  The expected result is negative.  Fact Sheet for Patients:   BoilerBrush.com.cy   Fact Sheet for Healthcare Providers:   https://pope.com/    This test is not yet approved or cleared by the [place] FDA and  has been authorized for detection and/or diagnosis of SARS-CoV-2 by FDA under an Emergency Use Authorization (EUA).  This EUA will remain in effect (meaning this test can be used) for the duration of  the COV ID-19 declaration under Section 564(b)(1) of the Act, 21 U.S.C. section 360-bbb-3(b)(1), unless the authorization is terminated or revoked sooner.  Performed at [facility], [address] [place], [place] [address]      Labs: BNP (last 3 results) No results for input(s): BNP in the last 8760 hours. Basic Metabolic Panel: Recent Labs  Lab 07/11/20 1413  NA 138  K 4.0  CL 101  CO2 29  GLUCOSE 112*  BUN 11  CREATININE 0.98  CALCIUM 9.4   Liver Function Tests: Recent Labs  Lab 07/11/20 1413  AST 27  ALT 35  ALKPHOS 65  BILITOT 0.8  PROT 7.4  ALBUMIN 3.9   No results for input(s): LIPASE, AMYLASE in the last 168 hours. No results for input(s): AMMONIA in the last 168 hours. CBC: Recent Labs  Lab 07/11/20 1413  WBC 8.6  NEUTROABS 4.6  HGB 15.3  HCT 44.5  MCV 91.4  PLT 320   Cardiac Enzymes: No results for input(s): CKTOTAL, CKMB, CKMBINDEX, TROPONINI in the last 168 hours. BNP: Invalid input(s): POCBNP CBG: No results for input(s): GLUCAP in the last 168 hours. D-Dimer Recent Labs    07/11/20 1413  DDIMER 0.29   Hgb A1c Recent Labs     07/12/20 0557  HGBA1C 6.8*   Lipid Profile Recent Labs    07/12/20 0557  CHOL 117  HDL 40*  LDLCALC 63  TRIG 72  CHOLHDL 2.9   Thyroid function studies Recent Labs    07/11/20  1413  TSH 0.621   Anemia work up No results for input(s): VITAMINB12, FOLATE, FERRITIN, TIBC, IRON, RETICCTPCT in the last 72 hours. Urinalysis    Component Value Date/Time   COLORURINE YELLOW 03/12/2013 0800   APPEARANCEUR CLEAR 03/12/2013 0800   LABSPEC 1.023 03/12/2013 0800   PHURINE 6.0 03/12/2013 0800   GLUCOSEU NEGATIVE 03/12/2013 0800   HGBUR SMALL (A) 03/12/2013 0800   BILIRUBINUR NEGATIVE 03/12/2013 0800   KETONESUR NEGATIVE 03/12/2013 0800   PROTEINUR NEGATIVE 03/12/2013 0800   UROBILINOGEN 1.0 03/12/2013 0800   NITRITE NEGATIVE 03/12/2013 0800   LEUKOCYTESUR NEGATIVE 03/12/2013 0800   Sepsis Labs Invalid input(s): PROCALCITONIN,  WBC,  LACTICIDVEN Microbiology Recent Results (from the past 240 hour(s))  SARS Coronavirus 2 by RT PCR (hospital order, performed in [facility] hospital lab) Nasopharyngeal Nasopharyngeal Swab     Status: Abnormal   Collection Time: 07/11/20  2:14 PM   Specimen: Nasopharyngeal Swab  Result Value Ref Range Status   SARS Coronavirus 2 POSITIVE (A) NEGATIVE Final    Comment: KARA RENNICKS RN @1558  07/11/2020 OLSONM (NOTE) SARS-CoV-2 target nucleic acids are DETECTED  SARS-CoV-2 RNA is generally detectable in upper respiratory specimens  during the acute phase of infection.  Positive results are indicative  of the presence of the identified virus, but do not rule out bacterial infection or co-infection with other pathogens not detected by the test.  Clinical correlation with patient history and  other diagnostic information is necessary to determine patient infection status.  The expected result is negative.  Fact Sheet for Patients:   07/13/2020   Fact Sheet for Healthcare Providers:    BoilerBrush.com.cy    This test is not yet approved or cleared by the https://pope.com/ FDA and  has been authorized for detection and/or diagnosis of SARS-CoV-2 by FDA under an Emergency Use Authorization (EUA).  This EUA will remain in effect (meaning this test can be used) for the duration of  the COV ID-19 declaration under Section 564(b)(1) of the Act, 21 U.S.C. section 360-bbb-3(b)(1), unless the authorization is terminated or revoked sooner.  Performed at [facility], [address] [place], [place]      Time coordinating discharge: Over 30 minutes  SIGNED:   [id number], DO [facility] 07/12/2020, 2:10 PM Pager   If 7PM-7AM, please contact night-coverage [email or web address]

## 2020-07-12 NOTE — ED Notes (Signed)
Lunch tray is ordered.  

## 2020-07-12 NOTE — Progress Notes (Signed)
  Echocardiogram 2D Echocardiogram has been performed.  Augustine Radar 07/12/2020, 10:54 AM

## 2020-07-12 NOTE — Progress Notes (Addendum)
STROKE TEAM PROGRESS NOTE   INTERVAL HISTORY No acute events since arrival.  Patient denies new symptoms of concern. He would like to go home today if he can. He specifically denies new vision changes, headache, new numbness, tingling or weakness. He is eating his breakfast without difficulty and with good appetite.  He states he will follow closely with his neurologist.   Vitals:   07/12/20 0500 07/12/20 0600 07/12/20 0630 07/12/20 0815  BP: 119/82 124/81 120/83 126/83  Pulse: 60 65 65 62  Resp: 13 13 15 13   Temp:      TempSrc:      SpO2: 97% 94% 95% 95%  Weight:      Height:       CBC:  Recent Labs  Lab 07/11/20 1413  WBC 8.6  NEUTROABS 4.6  HGB 15.3  HCT 44.5  MCV 91.4  PLT 320   Basic Metabolic Panel:  Recent Labs  Lab 07/11/20 1413  NA 138  K 4.0  CL 101  CO2 29  GLUCOSE 112*  BUN 11  CREATININE 0.98  CALCIUM 9.4   Lipid Panel:  Recent Labs  Lab 07/12/20 0557  CHOL 117  TRIG 72  HDL 40*  CHOLHDL 2.9  VLDL 14  LDLCALC 63   HgbA1c:  Recent Labs  Lab 07/12/20 0557  HGBA1C 6.8*   Urine Drug Screen:  Recent Labs  Lab 07/12/20 0847  LABOPIA NONE DETECTED  COCAINSCRNUR NONE DETECTED  LABBENZ NONE DETECTED  AMPHETMU NONE DETECTED  THCU NONE DETECTED  LABBARB NONE DETECTED    Alcohol Level No results for input(s): ETH in the last 168 hours.  IMAGING past 24 hours CT ANGIO HEAD W OR WO CONTRAST  Result Date: 07/11/2020 CLINICAL DATA:  Dizziness, sudden onset EXAM: CT ANGIOGRAPHY HEAD AND NECK TECHNIQUE: Multidetector CT imaging of the head and neck was performed using the standard protocol during bolus administration of intravenous contrast. Multiplanar CT image reconstructions and MIPs were obtained to evaluate the vascular anatomy. Carotid stenosis measurements (when applicable) are obtained utilizing NASCET criteria, using the distal internal carotid diameter as the denominator. CONTRAST:  34mL OMNIPAQUE IOHEXOL 350 MG/ML SOLN COMPARISON:   None. FINDINGS: CTA NECK FINDINGS SKELETON: There is no bony spinal canal stenosis. No lytic or blastic lesion. OTHER NECK: Normal pharynx, larynx and major salivary glands. No cervical lymphadenopathy. Unremarkable thyroid gland. UPPER CHEST: No pneumothorax or pleural effusion. No nodules or masses. AORTIC ARCH: There is no calcific atherosclerosis of the aortic arch. There is no aneurysm, dissection or hemodynamically significant stenosis of the visualized portion of the aorta. Conventional 3 vessel aortic branching pattern. The visualized proximal subclavian arteries are widely patent. RIGHT CAROTID SYSTEM: No dissection, occlusion or aneurysm. Mild atherosclerotic calcification at the carotid bifurcation without hemodynamically significant stenosis. LEFT CAROTID SYSTEM: No dissection, occlusion or aneurysm. Mild atherosclerotic calcification at the carotid bifurcation without hemodynamically significant stenosis. VERTEBRAL ARTERIES: Codominant configuration. Both origins are clearly patent. There is no dissection, occlusion or flow-limiting stenosis to the skull base (V1-V3 segments). CTA HEAD FINDINGS POSTERIOR CIRCULATION: --Vertebral arteries: Normal V4 segments. --Inferior cerebellar arteries: Normal. --Basilar artery: Normal. --Superior cerebellar arteries: Normal. --Posterior cerebral arteries (PCA): Normal. ANTERIOR CIRCULATION: --Intracranial internal carotid arteries: Normal. --Anterior cerebral arteries (ACA): Normal. Both A1 segments are present. Patent anterior communicating artery (a-comm). --Middle cerebral arteries (MCA): Normal. VENOUS SINUSES: As permitted by contrast timing, patent. ANATOMIC VARIANTS: None Review of the MIP images confirms the above findings. IMPRESSION: 1. No emergent large vessel occlusion or  high-grade stenosis of the intracranial arteries. 2. Mild bilateral carotid bifurcation atherosclerosis without hemodynamically significant stenosis by NASCET criteria. Electronically  Signed   By: Deatra Robinson M.D.   On: 07/11/2020 22:00   CT Head Wo Contrast  Result Date: 07/11/2020 CLINICAL DATA:  Dizziness EXAM: CT HEAD WITHOUT CONTRAST TECHNIQUE: Contiguous axial images were obtained from the base of the skull through the vertex without intravenous contrast. COMPARISON:  March 04, 2020 FINDINGS: Brain: No evidence of acute infarction, hemorrhage, hydrocephalus, extra-axial collection or mass lesion/mass effect. Vascular: Vascular calcifications of the carotid siphons. Skull: Normal. Negative for fracture or focal lesion. Sinuses/Orbits: Mucosal thickening of the LEFT sphenoid sinus. Mild mucosal thickening throughout the ethmoid air cells and frontal sinuses. Other: None. IMPRESSION: No acute intracranial abnormality. Electronically Signed   By: Meda Klinefelter MD   On: 07/11/2020 14:18   CT ANGIO NECK W OR WO CONTRAST  Result Date: 07/11/2020 CLINICAL DATA:  Dizziness, sudden onset EXAM: CT ANGIOGRAPHY HEAD AND NECK TECHNIQUE: Multidetector CT imaging of the head and neck was performed using the standard protocol during bolus administration of intravenous contrast. Multiplanar CT image reconstructions and MIPs were obtained to evaluate the vascular anatomy. Carotid stenosis measurements (when applicable) are obtained utilizing NASCET criteria, using the distal internal carotid diameter as the denominator. CONTRAST:  16mL OMNIPAQUE IOHEXOL 350 MG/ML SOLN COMPARISON:  None. FINDINGS: CTA NECK FINDINGS SKELETON: There is no bony spinal canal stenosis. No lytic or blastic lesion. OTHER NECK: Normal pharynx, larynx and major salivary glands. No cervical lymphadenopathy. Unremarkable thyroid gland. UPPER CHEST: No pneumothorax or pleural effusion. No nodules or masses. AORTIC ARCH: There is no calcific atherosclerosis of the aortic arch. There is no aneurysm, dissection or hemodynamically significant stenosis of the visualized portion of the aorta. Conventional 3 vessel aortic  branching pattern. The visualized proximal subclavian arteries are widely patent. RIGHT CAROTID SYSTEM: No dissection, occlusion or aneurysm. Mild atherosclerotic calcification at the carotid bifurcation without hemodynamically significant stenosis. LEFT CAROTID SYSTEM: No dissection, occlusion or aneurysm. Mild atherosclerotic calcification at the carotid bifurcation without hemodynamically significant stenosis. VERTEBRAL ARTERIES: Codominant configuration. Both origins are clearly patent. There is no dissection, occlusion or flow-limiting stenosis to the skull base (V1-V3 segments). CTA HEAD FINDINGS POSTERIOR CIRCULATION: --Vertebral arteries: Normal V4 segments. --Inferior cerebellar arteries: Normal. --Basilar artery: Normal. --Superior cerebellar arteries: Normal. --Posterior cerebral arteries (PCA): Normal. ANTERIOR CIRCULATION: --Intracranial internal carotid arteries: Normal. --Anterior cerebral arteries (ACA): Normal. Both A1 segments are present. Patent anterior communicating artery (a-comm). --Middle cerebral arteries (MCA): Normal. VENOUS SINUSES: As permitted by contrast timing, patent. ANATOMIC VARIANTS: None Review of the MIP images confirms the above findings. IMPRESSION: 1. No emergent large vessel occlusion or high-grade stenosis of the intracranial arteries. 2. Mild bilateral carotid bifurcation atherosclerosis without hemodynamically significant stenosis by NASCET criteria. Electronically Signed   By: Deatra Robinson M.D.   On: 07/11/2020 22:00   MR BRAIN WO CONTRAST  Result Date: 07/11/2020 CLINICAL DATA:  Sudden onset dizziness EXAM: MRI HEAD WITHOUT CONTRAST TECHNIQUE: Multiplanar, multiecho pulse sequences of the brain and surrounding structures were obtained without intravenous contrast. COMPARISON:  Brain MRI 03/04/2020 FINDINGS: Brain: Small acute infarct within the right parietal white matter, anterior to the site of the previously demonstrated infarct on 03/04/2020. No other site of  acute ischemia. No acute hemorrhage. Single focus of chronic microhemorrhage in the left centrum semiovale. There is multifocal hyperintense T2-weighted signal within the white matter. Parenchymal volume and CSF spaces are normal. The midline structures are  normal. Vascular: Major flow voids are preserved. Skull and upper cervical spine: Normal calvarium and skull base. Visualized upper cervical spine and soft tissues are normal. Sinuses/Orbits:Fluid in the left maxillary and sphenoid sinuses. Normal orbits. IMPRESSION: 1. Small acute infarct within the right parietal white matter , anterior to the site of the previous infarct of 02/2020. No acute hemorrhage or mass effect. 2. Findings of chronic small vessel disease. Electronically Signed   By: Deatra Robinson M.D.   On: 07/11/2020 19:46   PHYSICAL EXAM 60 yo male sitting up on side of bed eating breakfast in NAD HEENT: Alicia/AT  Lungs: Respirations unlabored Ext: No edema Mental Status: Awake and alert. Oriented x 5. Speech fluent and clear with intact comprehension and naming. Repeats 5 word sentence without difficulty.  Cranial Nerves: II:  Visual fields intact with no extinction to DSS. PERRL.  III,IV, VI: No ptosis. EOMI. No nystagmus.  V,VII: Smile symmetric, facial temp sensation equal bilaterally VIII: hearing intact to voice IX,X: No hypophonia XI: Symmetric XII: midline tongue extension  Motor: Right :  Upper extremity   5/5                                      Left:     Upper extremity   5/5             Lower extremity   5/5                                                  Lower extremity   5/5 No pronator drift Sensory: Temp and FT intact in BUE and RLE. Has decreased FT and temp sensation to entire LLE below the hip.  Cerebellar: No ataxia with FNF bilaterally  Gait: Deferred  ASSESSMENT/PLAN Per Dr. Shelbie Hutching consult note: 60 y.o. male with a prior history of stroke and pacemaker placement who presented this AM to [facility] with  headache and vertigo that started last night. He also complained of intermittent cough and runny nose "for a while". He tested positive for Covid despite having been vaccinated x 2. He was transferred to the [facility] ED, where MRI brain to further assess for the underlying etiology of the vertigo revealed a small acute infarct within the right parietal white matter, anterior to the site of a previous infarct of 02/2020. Exam reveals LLE sensory numbness. No focal motor deficit appreciated. NIHSS = 1.   Stroke - small acute infarct in the right parietal white matter likely due to SVD.   Code Stroke HCT: No acute intracranial abnormality.   CTA head & neck No emergent large vessel occlusion or high-grade stenosis of theintracranial arteries. Mild bilateral carotid bifurcation atherosclerosis without hemodynamically significant stenosis by NASCET criteria.  MRI  small acute infarct within the right parietal white matter, anterior to the site of a previous infarct of 02/2020  2D Echo EF is 55 to 60%.  LDL 63  HgbA1c 6.8  UDS negative  VTE prophylaxis SCDs  Cleared for regular diet   ASA 325mg  daily prior to admission. Now on 325mg  ASA and 75mg  Plavix. Follow up with his current neurologist [place].   Therapy recommendations:  Outpatient PT/OT  Disposition:  Home  History of stroke  06/2018 right MCA infarct  02/2020 right frontoparietal infarcts.  EF 55 to 60%, A1c 6.1 LDL 98.  Hypercoagulable work-up negative.  PFO negative.  Had a Zio patch for 14 days negative for A. Fib.  Recently had loop recorder placement 07/03/2020  Has been followed by his neurologist L[place] Rocker Redmond at [facility], last appointment 07/08/2020 (4 days ago)  HTN . BP stable . Long-term BP goal normotensive  Hyperlipidemia  Home meds: Lipitor 80mg  resumed in hospital  LDL 63, goal < 70  Continue statin at discharge  Covid infection  Covid PCR positive  Presented with headache and cough  Mild  symptoms  Management per primary team  Other Stroke Risk Factors    Other Active Problems    Hospital day # 0   Neurology will sign off. Please call with questions. Pt will follow up with his neurologist in [facility] in about 4 weeks. Thanks for the consult.  [facility], MD PhD Stroke Neurology 07/12/2020 5:41 PM  To contact Stroke Continuity provider, please refer to 07/14/2020. After hours, contact General Neurology

## 2021-12-16 IMAGING — CT CT HEAD W/O CM
3 series · 16 of 47 positions shown, 19 images · non-contrast
Comparison: March 04, 2020

CLINICAL DATA: Dizziness

EXAM:
CT HEAD WITHOUT CONTRAST
TECHNIQUE: Contiguous axial images were obtained from the base of the skull
through the vertex without intravenous contrast.

[Series 2: head wo · axial · 0.45mm/px · z∈[+1040,+1175]mm · 10 of 33 slices shown, 13 images]
[im 3/33  brain]
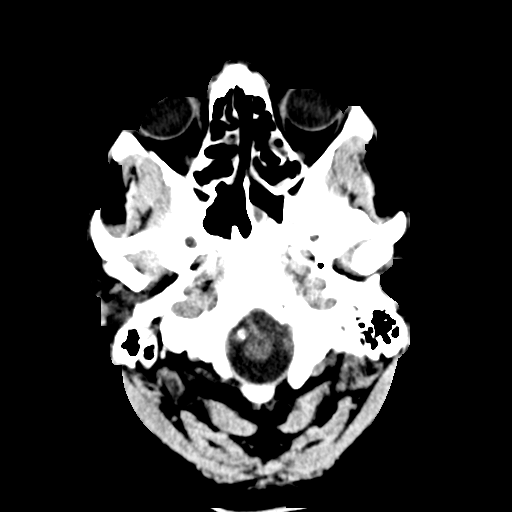
[im 3/33  bone]
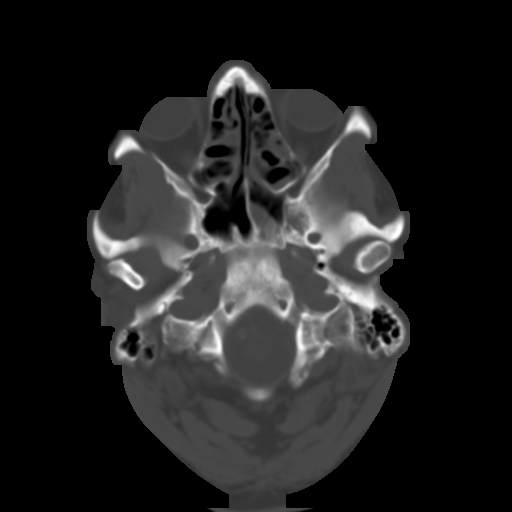
[im 6/33  brain]
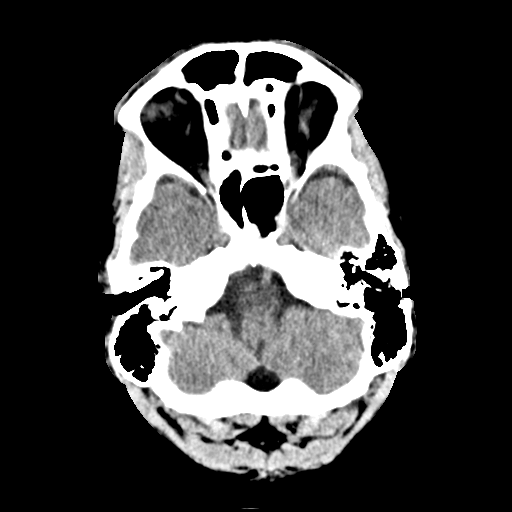
[im 9/33  brain]
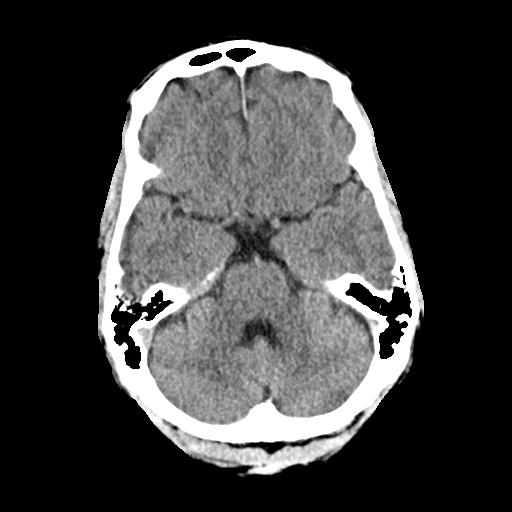
[im 12/33  brain]
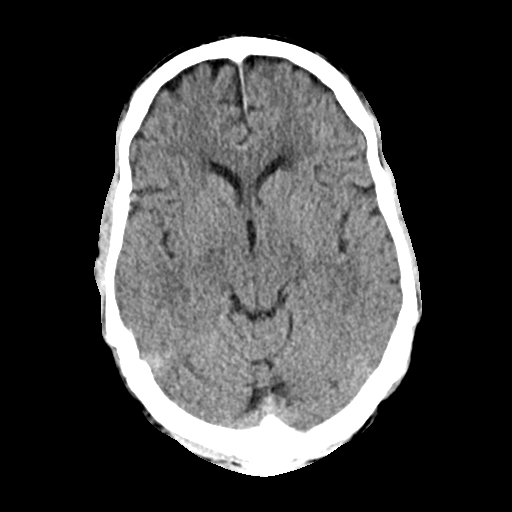
[im 15/33  brain]
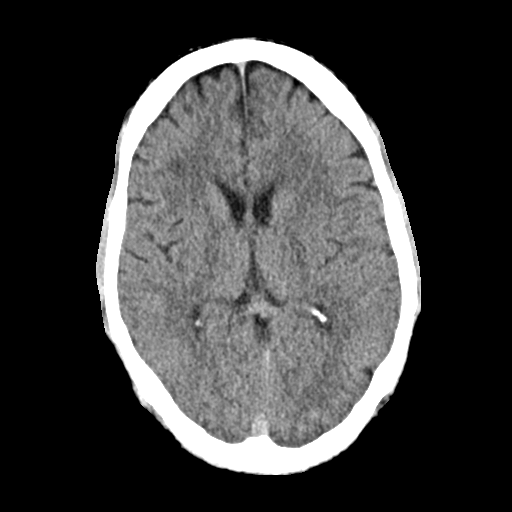
[im 15/33  bone]
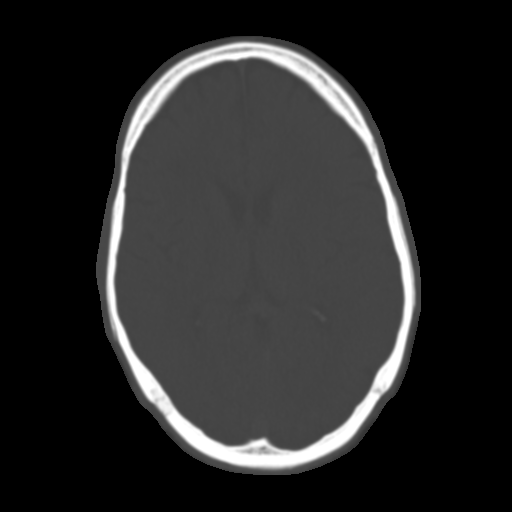
[im 18/33  brain]
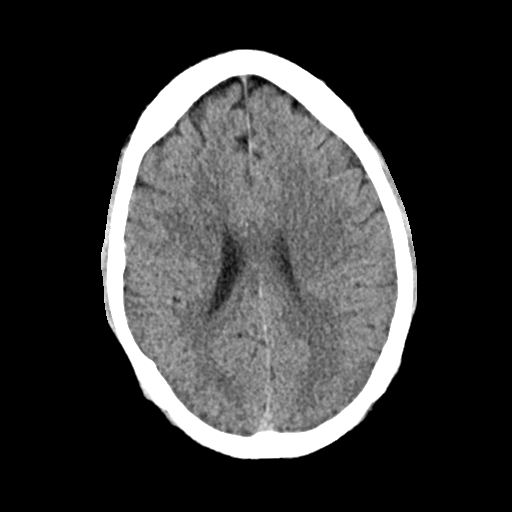
[im 21/33  brain]
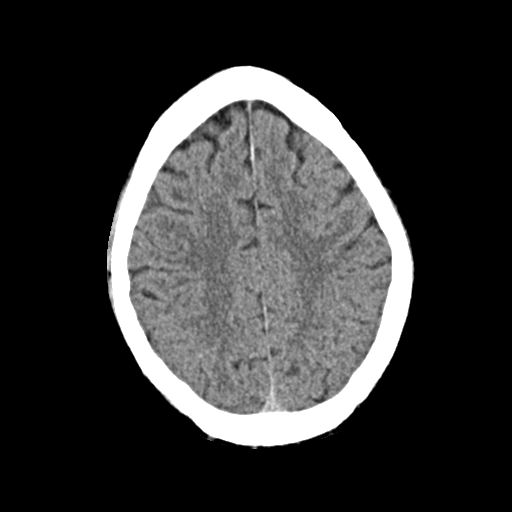
[im 25/33  brain]
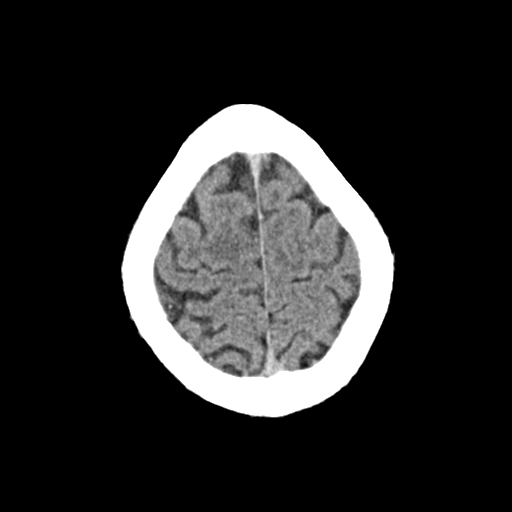
[im 27/33  brain]
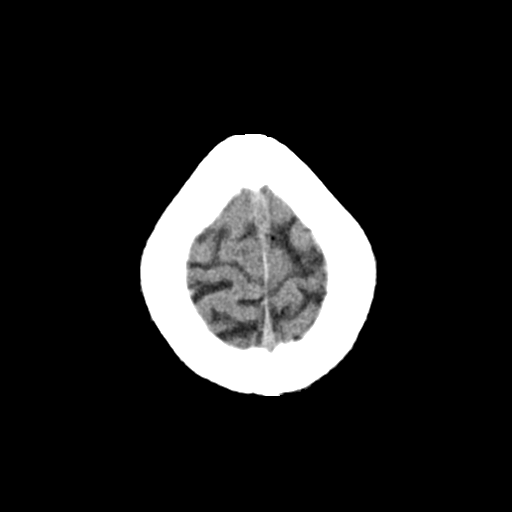
[im 27/33  bone]
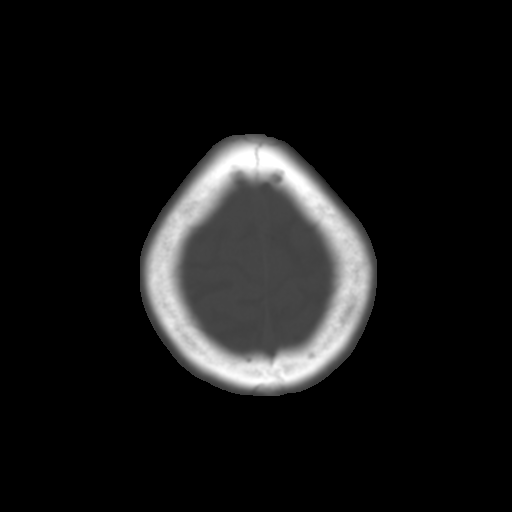
[im 30/33  brain]
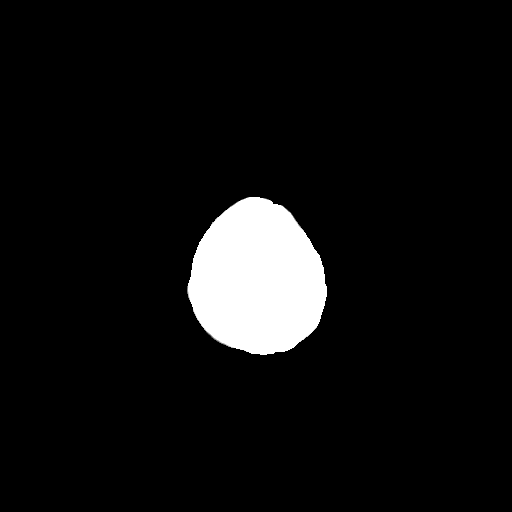

[Series 4: coronal soft · coronal · 0.35mm/px · 3 of 72 slices shown]
[im 24/72  brain]
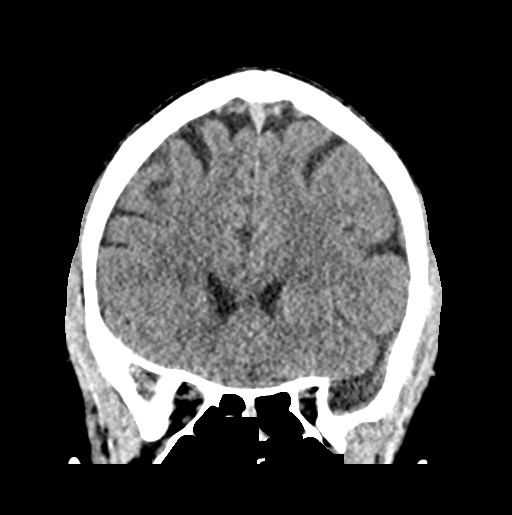
[im 32/72  brain]
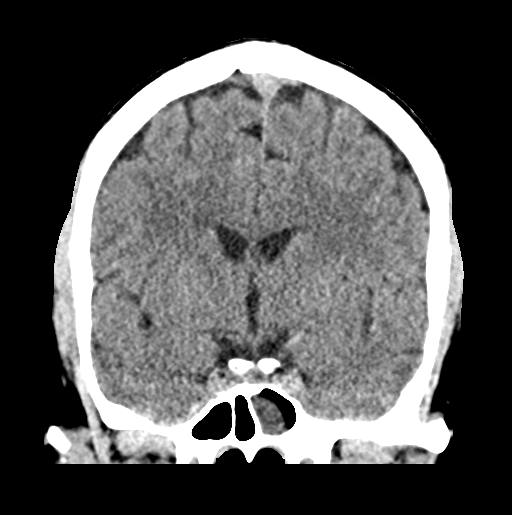
[im 40/72  brain]
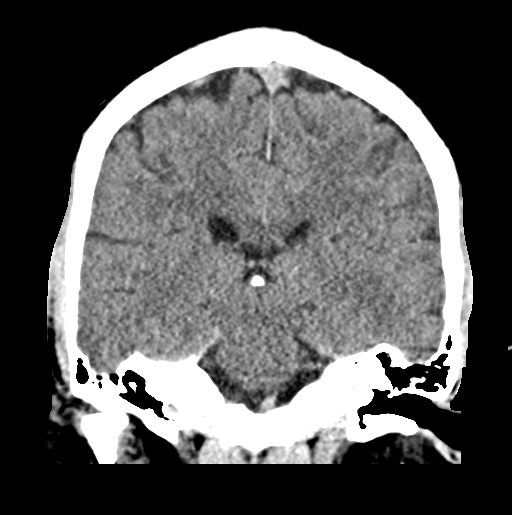

[Series 5: sag soft · sagittal · 0.34mm/px · 3 of 55 slices shown]
[im 19/55  brain]
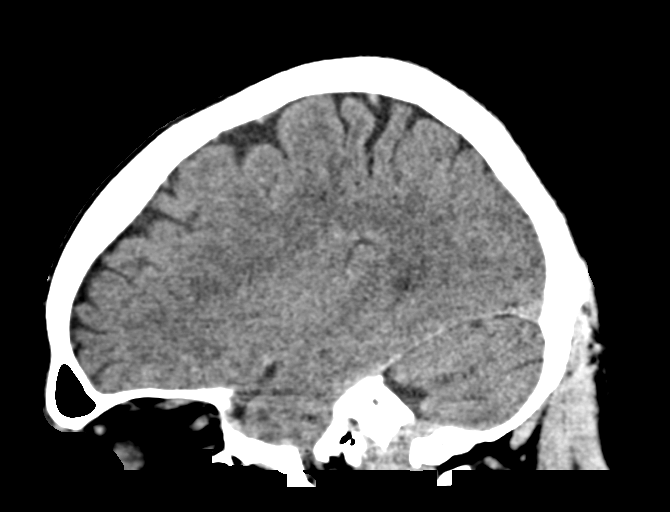
[im 28/55  brain]
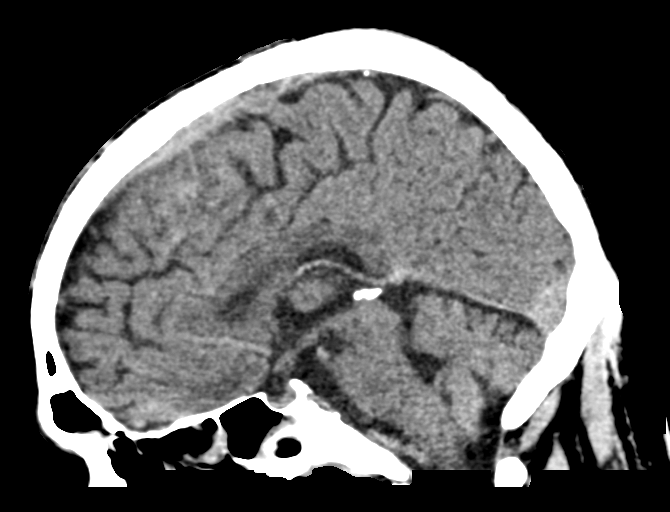
[im 37/55  brain]
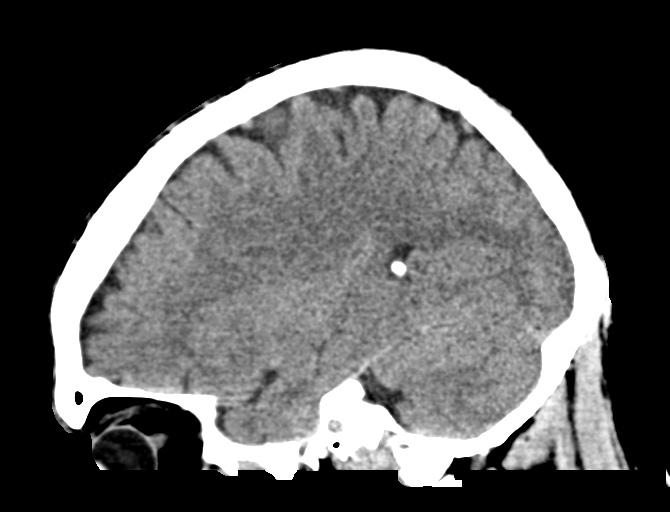

[16 of 47 positions shown; findings below may reference images not displayed]

FINDINGS: Brain: No evidence of acute infarction, hemorrhage, hydrocephalus,
extra-axial collection or mass lesion/mass effect.

Vascular: Vascular calcifications of the carotid siphons.

Skull: Normal. Negative for fracture or focal lesion.

Sinuses/Orbits: Mucosal thickening of the LEFT sphenoid sinus. Mild
mucosal thickening throughout the ethmoid air cells and frontal
sinuses.

Other: None.
IMPRESSION: No acute intracranial abnormality.

## 2021-12-16 IMAGING — CT CT ANGIO HEAD
2 of 7 series · 8 of 33 positions shown · IV contrast (APPLIED)
Comparison: None.

CLINICAL DATA: Dizziness, sudden onset

EXAM:
CT ANGIOGRAPHY HEAD AND NECK
TECHNIQUE: Multidetector CT imaging of the head and neck was performed using
the standard protocol during bolus administration of intravenous
contrast. Multiplanar CT image reconstructions and MIPs were
obtained to evaluate the vascular anatomy. Carotid stenosis
measurements (when applicable) are obtained utilizing NASCET
criteria, using the distal internal carotid diameter as the
denominator.
CONTRAST:  75mL OMNIPAQUE IOHEXOL 350 MG/ML SOLN

[Series 5: cta neck/head · axial · 0.48mm/px · z∈[+1016,+1144]mm · 2 of 194 slices shown]
[im 65/194  soft-tissue]
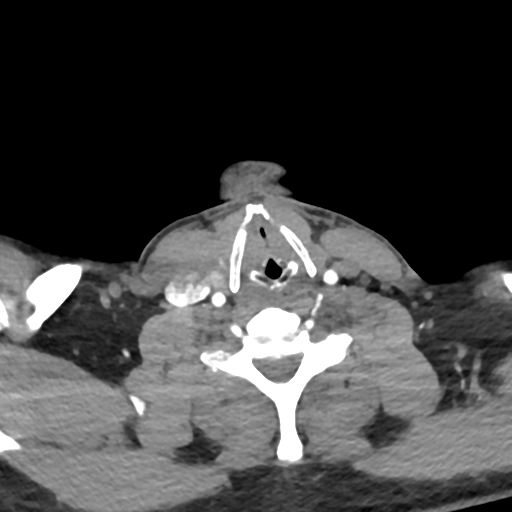
[im 129/194  soft-tissue]
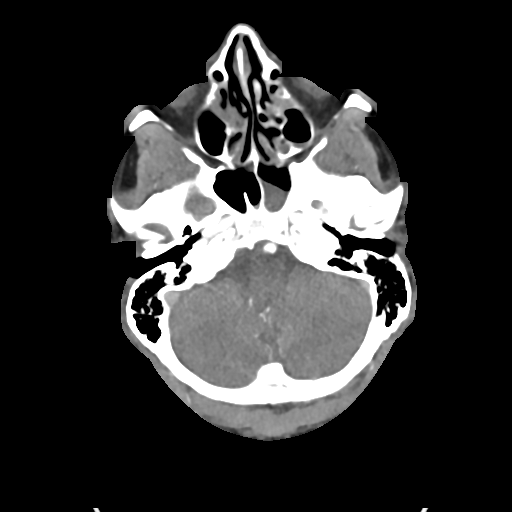

[Series 7: ax thins · axial · 0.60mm/px · z∈[+944,+1224]mm · 6 of 394 slices shown]
[im 57/394  soft-tissue]
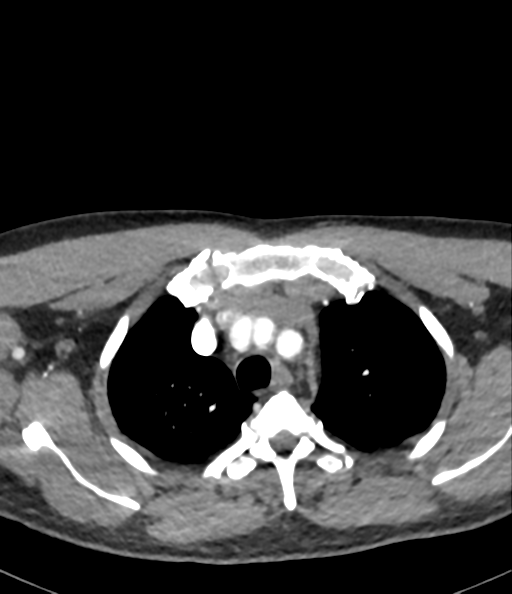
[im 113/394  bone]
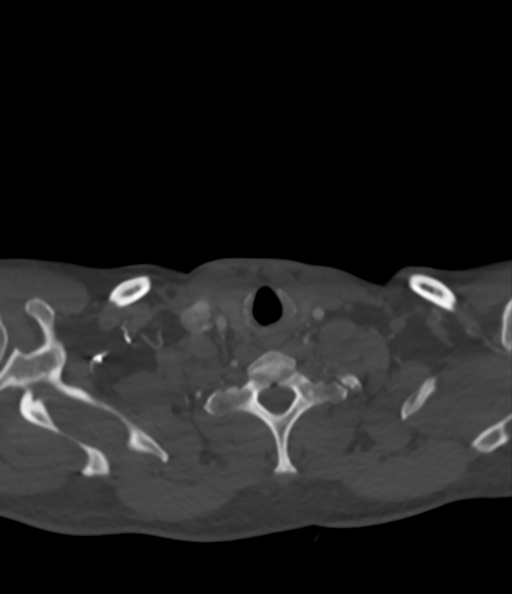
[im 169/394  soft-tissue]
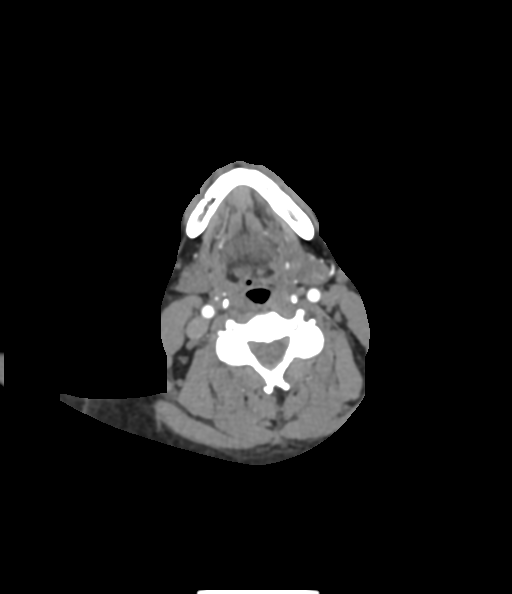
[im 225/394  bone]
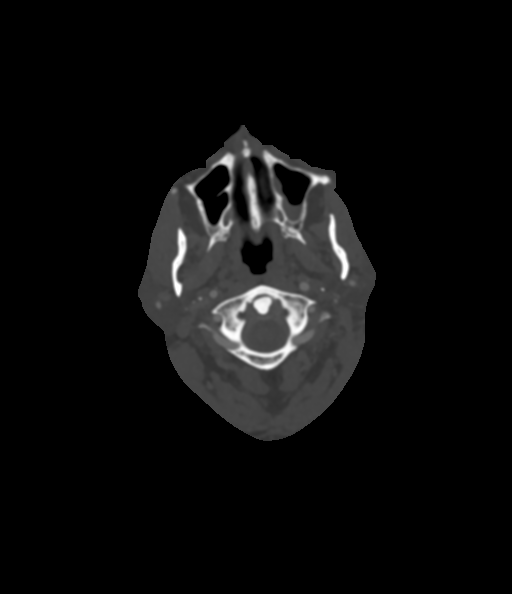
[im 281/394  soft-tissue]
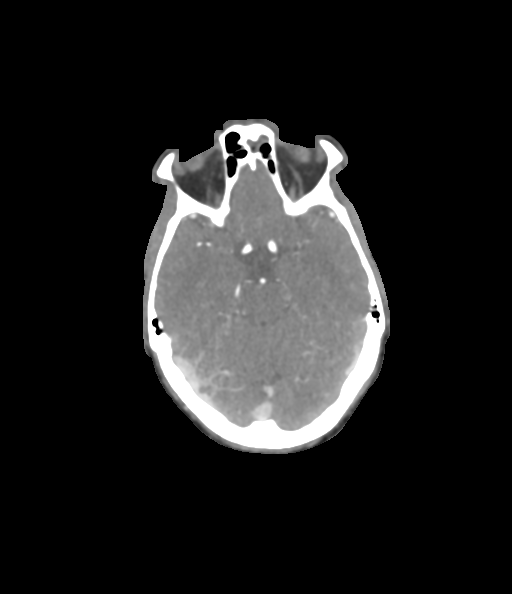
[im 337/394  bone]
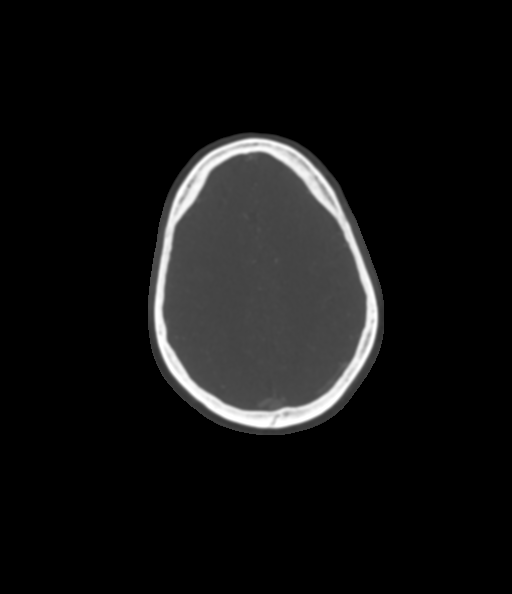

[8 of 33 positions shown; findings below may reference images not displayed]

FINDINGS: CTA NECK FINDINGS

SKELETON: There is no bony spinal canal stenosis. No lytic or
blastic lesion.

OTHER NECK: Normal pharynx, larynx and major salivary glands. No
cervical lymphadenopathy. Unremarkable thyroid gland.

UPPER CHEST: No pneumothorax or pleural effusion. No nodules or
masses.

AORTIC ARCH:

There is no calcific atherosclerosis of the aortic arch. There is no
aneurysm, dissection or hemodynamically significant stenosis of the
visualized portion of the aorta. Conventional 3 vessel aortic
branching pattern. The visualized proximal subclavian arteries are
widely patent.

RIGHT CAROTID SYSTEM: No dissection, occlusion or aneurysm. Mild
atherosclerotic calcification at the carotid bifurcation without
hemodynamically significant stenosis.

LEFT CAROTID SYSTEM: No dissection, occlusion or aneurysm. Mild
atherosclerotic calcification at the carotid bifurcation without
hemodynamically significant stenosis.

VERTEBRAL ARTERIES: Codominant configuration. Both origins are
clearly patent. There is no dissection, occlusion or flow-limiting
stenosis to the skull base (V1-V3 segments).

CTA HEAD FINDINGS

POSTERIOR CIRCULATION:

--Vertebral arteries: Normal V4 segments.

--Inferior cerebellar arteries: Normal.

--Basilar artery: Normal.

--Superior cerebellar arteries: Normal.

--Posterior cerebral arteries (PCA): Normal.

ANTERIOR CIRCULATION:

--Intracranial internal carotid arteries: Normal.

--Anterior cerebral arteries (ACA): Normal. Both A1 segments are
present. Patent anterior communicating artery (a-comm).

--Middle cerebral arteries (MCA): Normal.

VENOUS SINUSES: As permitted by contrast timing, patent.

ANATOMIC VARIANTS: None

Review of the MIP images confirms the above findings.
IMPRESSION: 1. No emergent large vessel occlusion or high-grade stenosis of the
intracranial arteries.
2. Mild bilateral carotid bifurcation atherosclerosis without
hemodynamically significant stenosis by NASCET criteria.
